# Patient Record
Sex: Female | Born: 1950 | Race: White | Hispanic: No | Marital: Married
Health system: Southern US, Community
[De-identification: ages and names within clinical notes are randomized; demographics above are authoritative.]

## PROBLEM LIST (undated history)

## (undated) DIAGNOSIS — E039 Hypothyroidism, unspecified: Secondary | ICD-10-CM

## (undated) DIAGNOSIS — K589 Irritable bowel syndrome without diarrhea: Secondary | ICD-10-CM

## (undated) DIAGNOSIS — G8929 Other chronic pain: Secondary | ICD-10-CM

## (undated) DIAGNOSIS — E079 Disorder of thyroid, unspecified: Secondary | ICD-10-CM

## (undated) DIAGNOSIS — I4891 Unspecified atrial fibrillation: Secondary | ICD-10-CM

## (undated) DIAGNOSIS — E119 Type 2 diabetes mellitus without complications: Secondary | ICD-10-CM

## (undated) HISTORY — PX: COLPOSCOPY W/ BIOPSY / CURETTAGE: SUR283

## (undated) HISTORY — PX: TUBAL LIGATION: SHX77

---

## 2020-07-13 ENCOUNTER — Emergency Department (HOSPITAL_COMMUNITY): Payer: Medicare Other

## 2020-07-13 ENCOUNTER — Encounter (HOSPITAL_COMMUNITY): Payer: Self-pay | Admitting: Emergency Medicine

## 2020-07-13 ENCOUNTER — Observation Stay (HOSPITAL_COMMUNITY)
Admission: EM | Admit: 2020-07-13 | Discharge: 2020-07-14 | Disposition: A | Discharge status: Home / Self Care | Payer: Medicare Other | Attending: Emergency Medicine | Admitting: Emergency Medicine

## 2020-07-13 ENCOUNTER — Other Ambulatory Visit: Payer: Self-pay

## 2020-07-13 DIAGNOSIS — I639 Cerebral infarction, unspecified: Secondary | ICD-10-CM | POA: Diagnosis not present

## 2020-07-13 DIAGNOSIS — Z79899 Other long term (current) drug therapy: Secondary | ICD-10-CM | POA: Insufficient documentation

## 2020-07-13 DIAGNOSIS — Z7982 Long term (current) use of aspirin: Secondary | ICD-10-CM | POA: Insufficient documentation

## 2020-07-13 DIAGNOSIS — Z7901 Long term (current) use of anticoagulants: Secondary | ICD-10-CM | POA: Insufficient documentation

## 2020-07-13 DIAGNOSIS — E039 Hypothyroidism, unspecified: Secondary | ICD-10-CM | POA: Insufficient documentation

## 2020-07-13 DIAGNOSIS — I10 Essential (primary) hypertension: Secondary | ICD-10-CM | POA: Insufficient documentation

## 2020-07-13 DIAGNOSIS — Z20822 Contact with and (suspected) exposure to covid-19: Secondary | ICD-10-CM | POA: Insufficient documentation

## 2020-07-13 DIAGNOSIS — I4891 Unspecified atrial fibrillation: Secondary | ICD-10-CM | POA: Insufficient documentation

## 2020-07-13 DIAGNOSIS — H538 Other visual disturbances: Secondary | ICD-10-CM | POA: Diagnosis present

## 2020-07-13 HISTORY — DX: Irritable bowel syndrome without diarrhea: K58.9

## 2020-07-13 HISTORY — DX: Other chronic pain: G89.29

## 2020-07-13 HISTORY — DX: Hypothyroidism, unspecified: E03.9

## 2020-07-13 HISTORY — DX: Unspecified atrial fibrillation: I48.91

## 2020-07-13 HISTORY — DX: Disorder of thyroid, unspecified: E07.9

## 2020-07-13 HISTORY — DX: Type 2 diabetes mellitus without complications: E11.9

## 2020-07-13 LAB — CBC
HCT: 46.5 % — ABNORMAL HIGH (ref 36.0–46.0)
Hemoglobin: 15.6 g/dL — ABNORMAL HIGH (ref 12.0–15.0)
MCH: 30.5 pg (ref 26.0–34.0)
MCHC: 33.5 g/dL (ref 30.0–36.0)
MCV: 90.8 fL (ref 80.0–100.0)
Platelets: 255 10*3/uL (ref 150–400)
RBC: 5.12 MIL/uL — ABNORMAL HIGH (ref 3.87–5.11)
RDW: 13.8 % (ref 11.5–15.5)
WBC: 10.1 10*3/uL (ref 4.0–10.5)
nRBC: 0 % (ref 0.0–0.2)

## 2020-07-13 LAB — COMPREHENSIVE METABOLIC PANEL
ALT: 23 U/L (ref 0–44)
AST: 31 U/L (ref 15–41)
Albumin: 4.3 g/dL (ref 3.5–5.0)
Alkaline Phosphatase: 65 U/L (ref 38–126)
Anion gap: 17 — ABNORMAL HIGH (ref 5–15)
BUN: 8 mg/dL (ref 8–23)
CO2: 28 mmol/L (ref 22–32)
Calcium: 9.8 mg/dL (ref 8.9–10.3)
Chloride: 93 mmol/L — ABNORMAL LOW (ref 98–111)
Creatinine, Ser: 0.98 mg/dL (ref 0.44–1.00)
GFR, Estimated: >60 mL/min (ref >60)
Glucose, Bld: 154 mg/dL — ABNORMAL HIGH (ref 70–99)
Potassium: 3.3 mmol/L — ABNORMAL LOW (ref 3.5–5.1)
Sodium: 138 mmol/L (ref 135–145)
Total Bilirubin: 1.3 mg/dL — ABNORMAL HIGH (ref 0.3–1.2)
Total Protein: 8.9 g/dL — ABNORMAL HIGH (ref 6.5–8.1)

## 2020-07-13 LAB — RESPIRATORY PANEL BY RT PCR (FLU A&B, COVID)
Influenza A by PCR: NEGATIVE
Influenza B by PCR: NEGATIVE
SARS Coronavirus 2 by RT PCR: NEGATIVE

## 2020-07-13 LAB — I-STAT CHEM 8, ED
BUN: 8 mg/dL (ref 8–23)
Calcium, Ion: 1.11 mmol/L — ABNORMAL LOW (ref 1.15–1.40)
Chloride: 94 mmol/L — ABNORMAL LOW (ref 98–111)
Creatinine, Ser: 0.9 mg/dL (ref 0.44–1.00)
Glucose, Bld: 150 mg/dL — ABNORMAL HIGH (ref 70–99)
HCT: 48 % — ABNORMAL HIGH (ref 36.0–46.0)
Hemoglobin: 16.3 g/dL — ABNORMAL HIGH (ref 12.0–15.0)
Potassium: 3.2 mmol/L — ABNORMAL LOW (ref 3.5–5.1)
Sodium: 139 mmol/L (ref 135–145)
TCO2: 30 mmol/L (ref 22–32)

## 2020-07-13 LAB — DIFFERENTIAL
Abs Immature Granulocytes: 0.03 10*3/uL (ref 0.00–0.07)
Basophils Absolute: 0.1 10*3/uL (ref 0.0–0.1)
Basophils Relative: 1 %
Eosinophils Absolute: 0 10*3/uL (ref 0.0–0.5)
Eosinophils Relative: 0 %
Immature Granulocytes: 0 %
Lymphocytes Relative: 10 %
Lymphs Abs: 1 10*3/uL (ref 0.7–4.0)
Monocytes Absolute: 0.6 10*3/uL (ref 0.1–1.0)
Monocytes Relative: 6 %
Neutro Abs: 8.3 10*3/uL — ABNORMAL HIGH (ref 1.7–7.7)
Neutrophils Relative %: 83 %

## 2020-07-13 LAB — GLUCOSE, CAPILLARY: Glucose-Capillary: 131 mg/dL — ABNORMAL HIGH (ref 70–99)

## 2020-07-13 LAB — APTT: aPTT: 32 seconds (ref 24–36)

## 2020-07-13 LAB — PROTIME-INR
INR: 1.2 (ref 0.8–1.2)
Prothrombin Time: 14.9 seconds (ref 11.4–15.2)

## 2020-07-13 IMAGING — MR MR HEAD WO/W CM
12 of 14 series · 40 of 48 positions shown · IV contrast (gadavist)
Comparison: Same day head CT.

EXAM:
MRI HEAD WITHOUT AND WITH CONTRAST
TECHNIQUE: Multiplanar, multiecho pulse sequences of the brain and surrounding
structures were obtained without and with intravenous contrast.

CONTRAST:  10mL GADAVIST GADOBUTROL 1 MMOL/ML IV SOLN

[Series 5: DWI · axial · 3.0mm · 0.88mm/px · z∈[-119,+28]mm · 8 of 100 slices shown (1 of 4)]
[im 1/100]
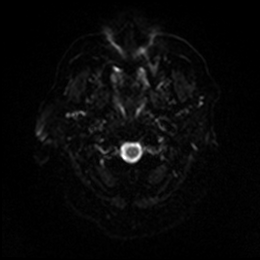
[im 15/100]
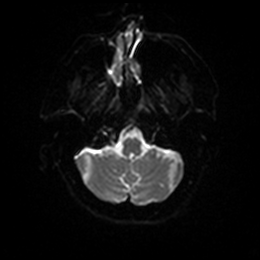
[im 29/100]
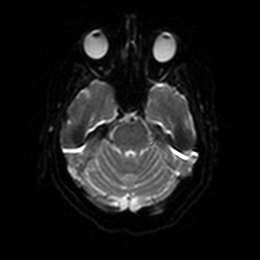
[im 43/100]
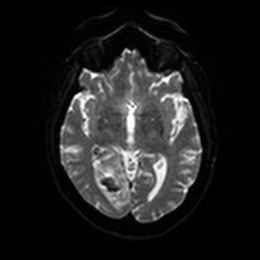
[im 57/100]
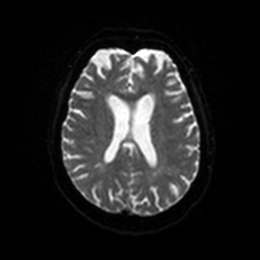
[im 71/100]
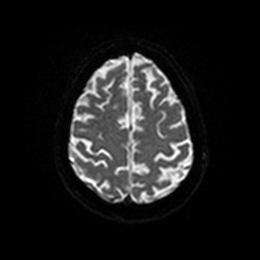
[im 85/100]
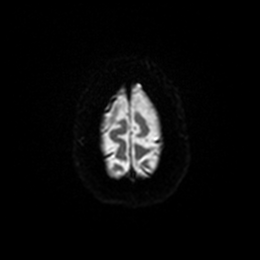
[im 100/100]
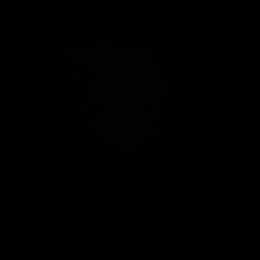

[Series 6: DWI · axial · 3.0mm · 0.88mm/px · z∈[-119,+25]mm · 4 of 49 slices shown (2 of 4)]
[im 1/49]
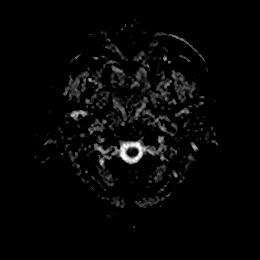
[im 17/49]
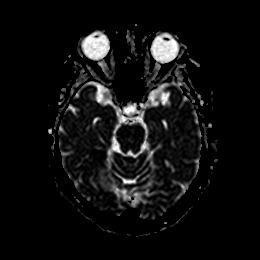
[im 33/49]
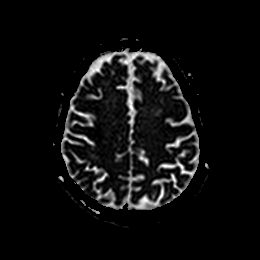
[im 49/49]
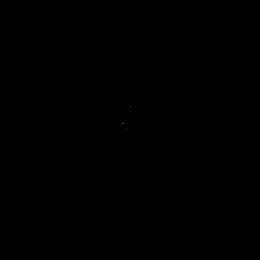

[Series 7: DWI · coronal · 4.0mm · 0.88mm/px · 5 of 68 slices shown (3 of 4)]
[im 1/68]
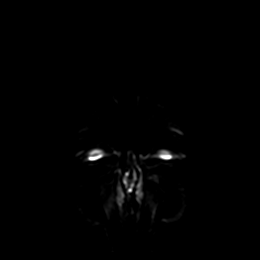
[im 17/68]
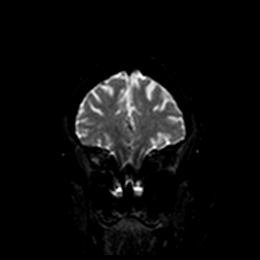
[im 34/68]
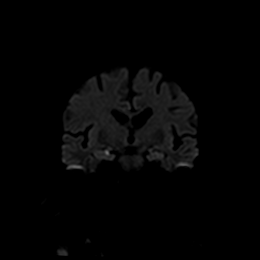
[im 51/68]
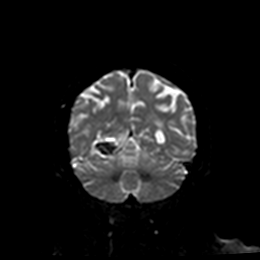
[im 68/68]
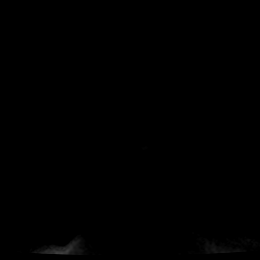

[Series 8: DWI · coronal · 4.0mm · 0.88mm/px · 3 of 34 slices shown (4 of 4)]
[im 1/34]
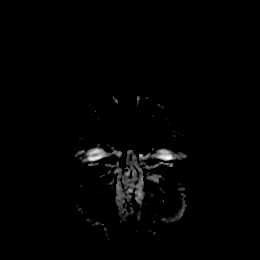
[im 17/34]
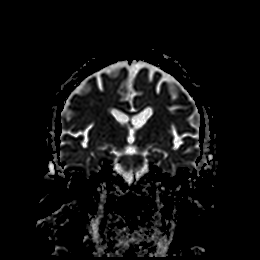
[im 34/34]
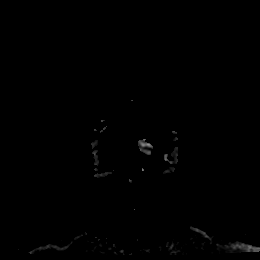

[Series 9: T1 · sagittal · 5.0mm · 0.75mm/px · 2 of 25 slices shown]
[im 1/25]
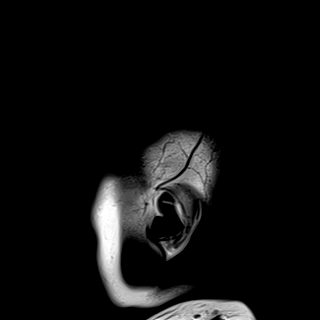
[im 25/25]
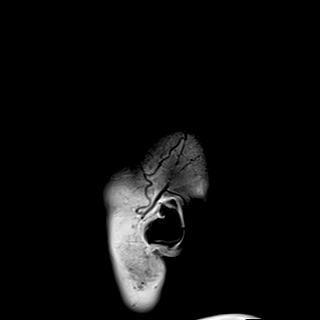

[Series 10: T2 · axial · 5.0mm · 0.72mm/px · z∈[-117,+26]mm · 2 of 25 slices shown]
[im 1/25]
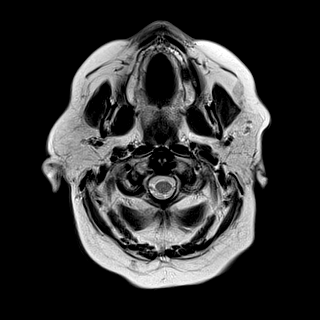
[im 25/25]
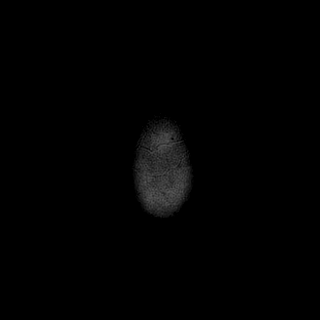

[Series 11: FLAIR · axial · 5.0mm · 0.45mm/px · z∈[-119,+25]mm · 2 of 25 slices shown]
[im 1/25]
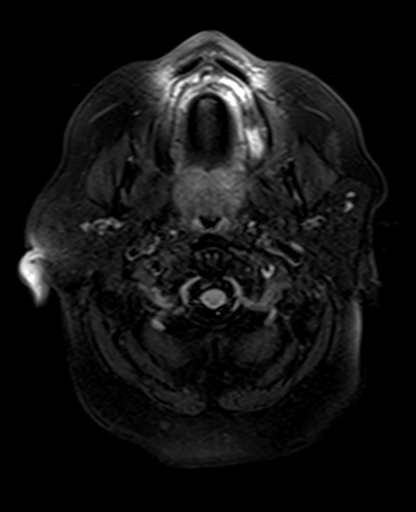
[im 25/25]
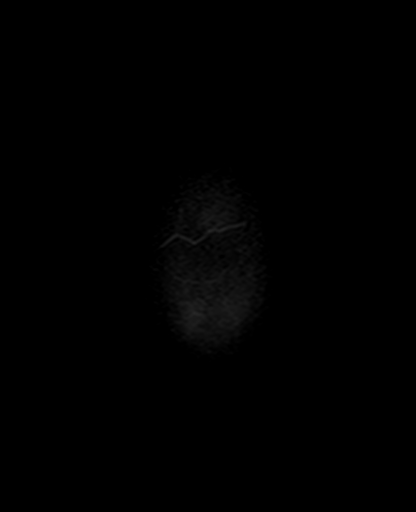

[Series 13: pha_images · axial · 3.0mm · 0.90mm/px · z∈[-129,+35]mm · 4 of 56 slices shown]
[im 1/56]
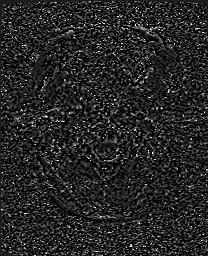
[im 19/56]
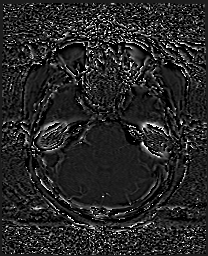
[im 37/56]
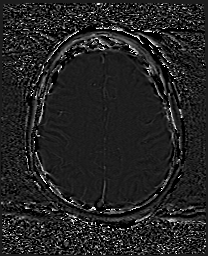
[im 56/56]
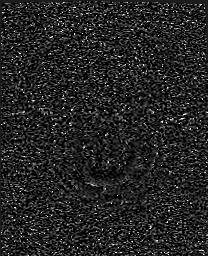

[Series 14: swi_images · axial · 3.0mm · 0.90mm/px · z∈[-129,+35]mm · 4 of 56 slices shown]
[im 1/56]
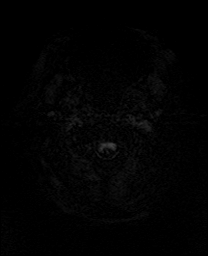
[im 19/56]
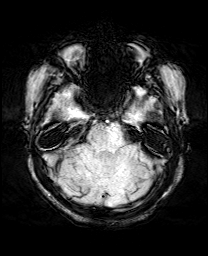
[im 37/56]
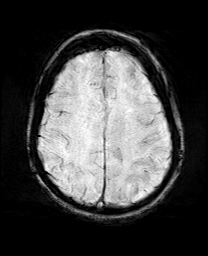
[im 56/56]
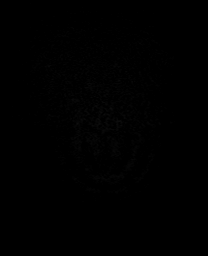

[Series 17: T2 post-contrast · coronal · 5.0mm · 0.72mm/px · 2 of 30 slices shown]
[im 1/30]
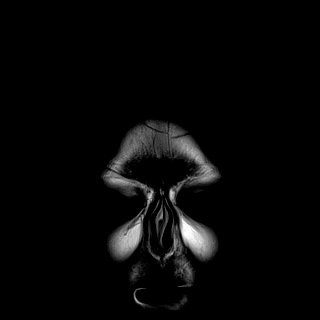
[im 30/30]
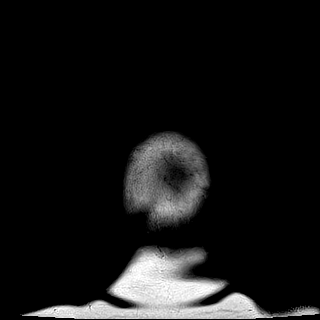

[Series 19: T1 post-contrast · coronal · 5.0mm · 0.34mm/px · 2 of 30 slices shown (1 of 2)]
[im 1/30]
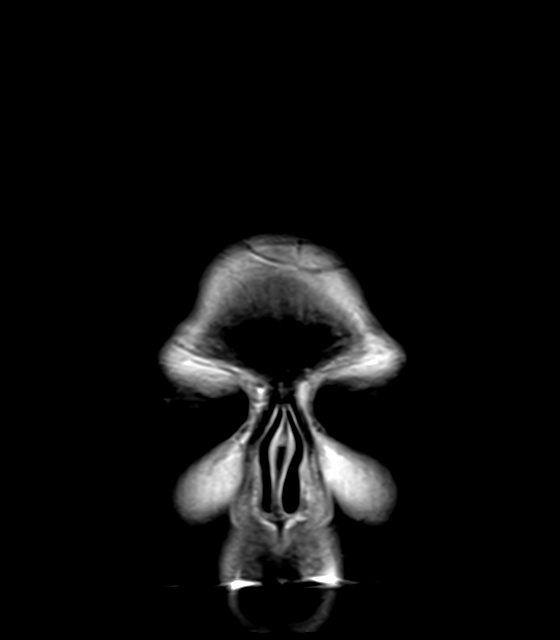
[im 30/30]
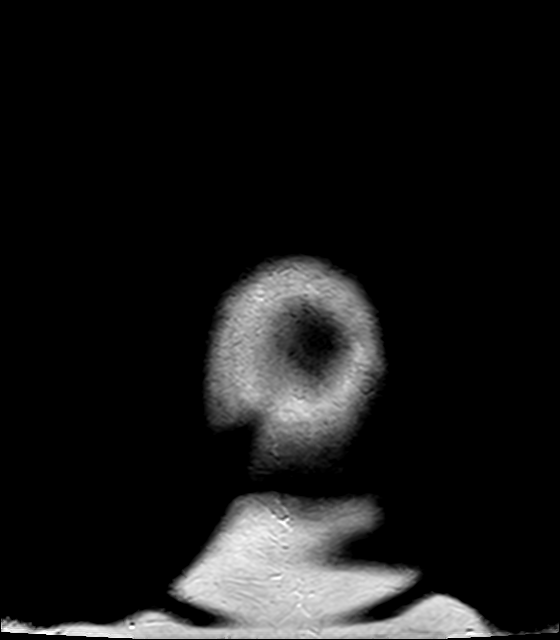

[Series 20: T1 post-contrast · sagittal · 5.0mm · 0.75mm/px · 2 of 25 slices shown (2 of 2)]
[im 1/25]
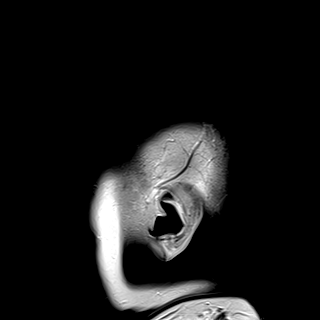
[im 25/25]
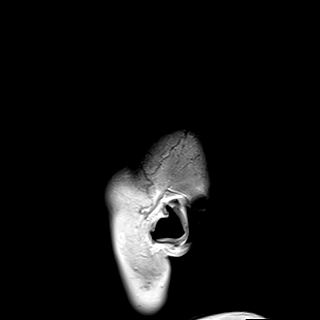

[40 of 48 positions shown; findings below may reference images not displayed]

FINDINGS: Brain: Acute confluent infarct in the right occipital lobe. There is
local mass effect without midline shift. Additional smaller infarcts
in the right hippocampus and right aspect of the splenium of the
corpus callosum. Possible punctate infarct in the right thalamus
(series 5, image 77). Associated acute hemorrhage in the right
occipital lobe and parahippocampal region . No hydrocephalus. Remote
bilateral cerebellar lacunar infarcts. Mild scattered T2/FLAIR
hyperintensities, compatible with chronic microvascular ischemic
disease. No masslike enhancement.

Vascular: Major arterial flow voids are maintained at the skull
base.

Skull and upper cervical spine: Normal marrow signal.

Sinuses/Orbits: Negative.  Atretic right maxillary sinus.

Other: No mastoid effusions.
IMPRESSION: Acute or early subacute right PCA territory infarcts involving the
right occipital lobe, right hippocampus, right eccentric splenium of
the corpus callosum, and possibly the right thalamus. There is
associated acute hemorrhage in the right occipital lobe and
parahippocampal region. Local mass effect without midline shift.

Finding discussed with Dr. OURARI At [DATE] via telephone.

## 2020-07-13 IMAGING — CT CT HEAD W/O CM
4 series · 15 of 47 positions shown, 17 images · non-contrast
Comparison: None.

CLINICAL DATA: Right-sided vision loss.

EXAM:
CT HEAD WITHOUT CONTRAST
TECHNIQUE: Contiguous axial images were obtained from the base of the skull
through the vertex without intravenous contrast.

[Series 3: head wo · axial · 0.47mm/px · z∈[+1198,+1302]mm · 7 of 29 slices shown, 9 images]
[im 4/29  brain]
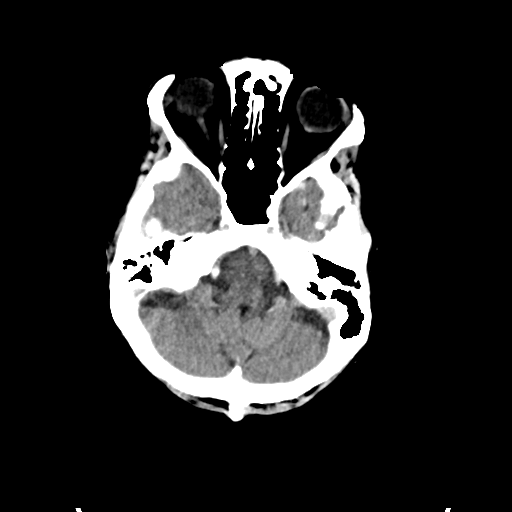
[im 4/29  bone]
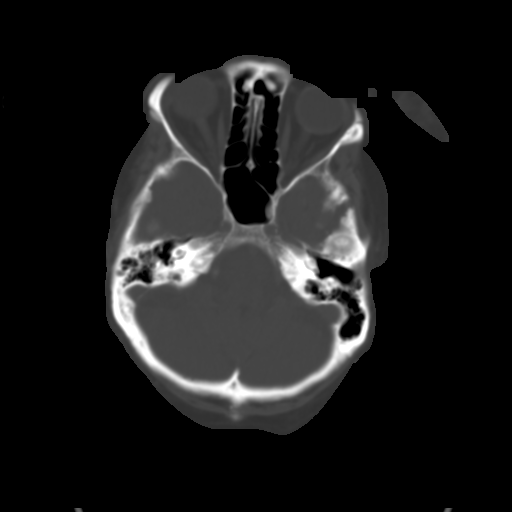
[im 8/29  brain]
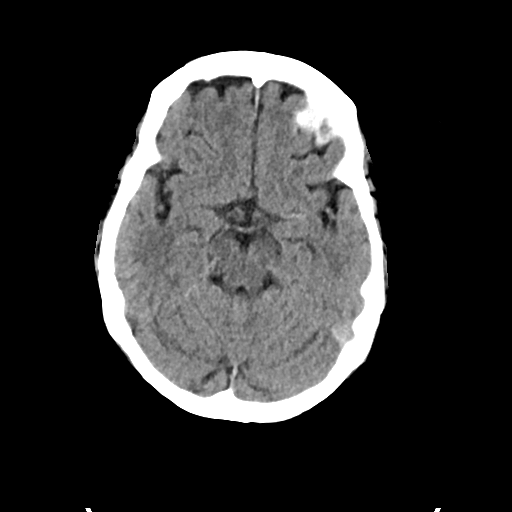
[im 11/29  brain]
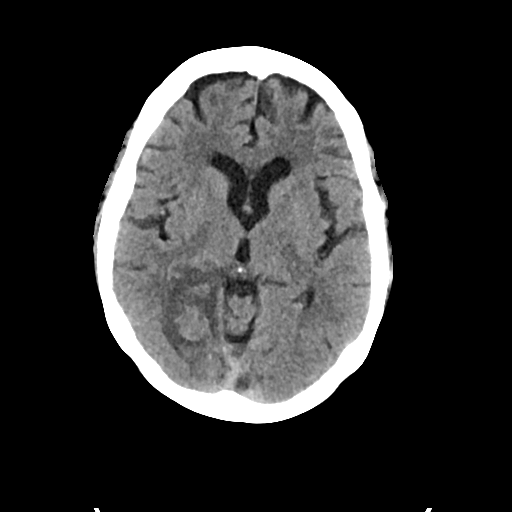
[im 15/29  brain]
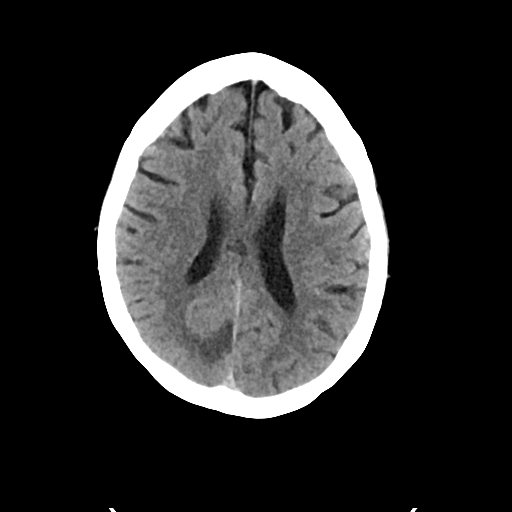
[im 18/29  brain]
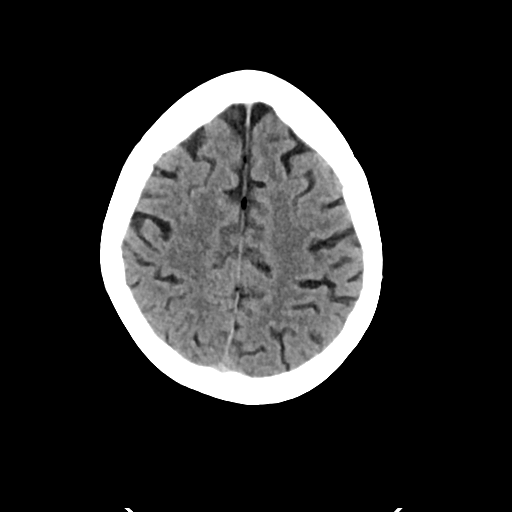
[im 18/29  bone]
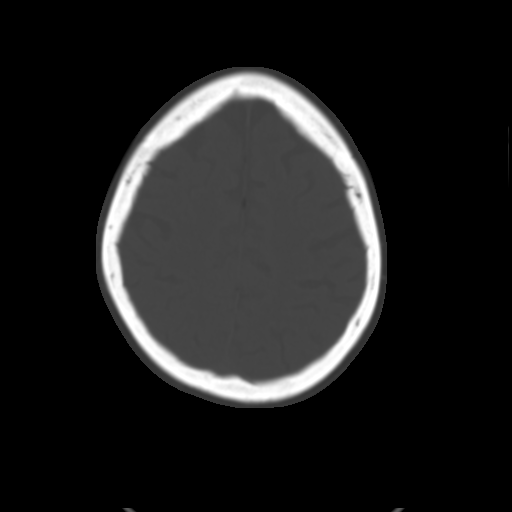
[im 22/29  brain]
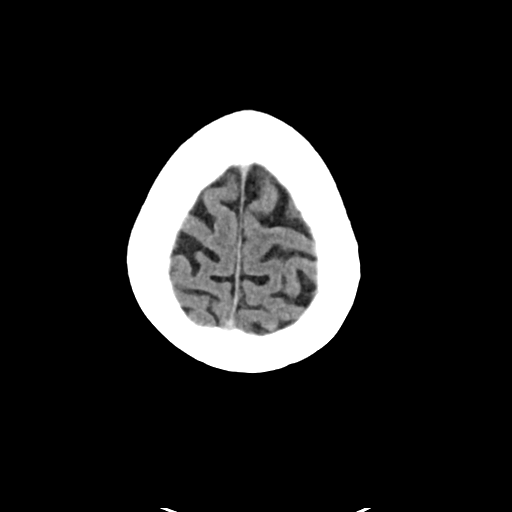
[im 25/29  brain]
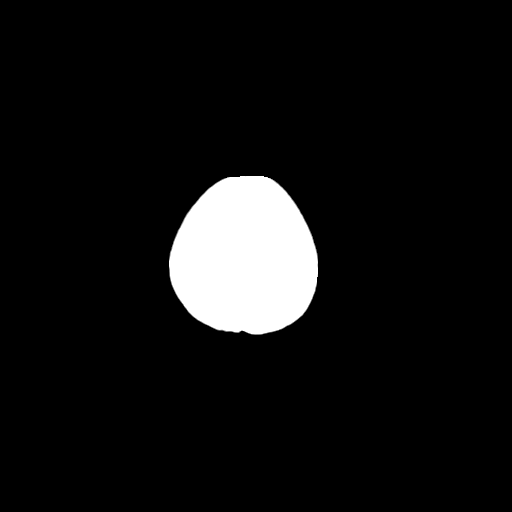

[Series 4: head bone · axial · 0.47mm/px · z∈[+1196,+1210]mm · 2 of 73 slices shown]
[im 8/73  bone]
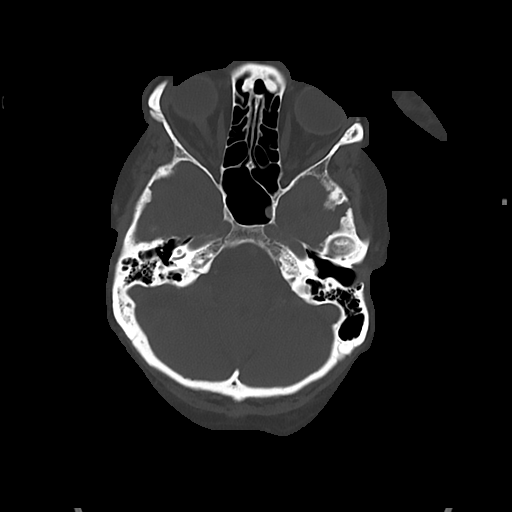
[im 15/73  bone]
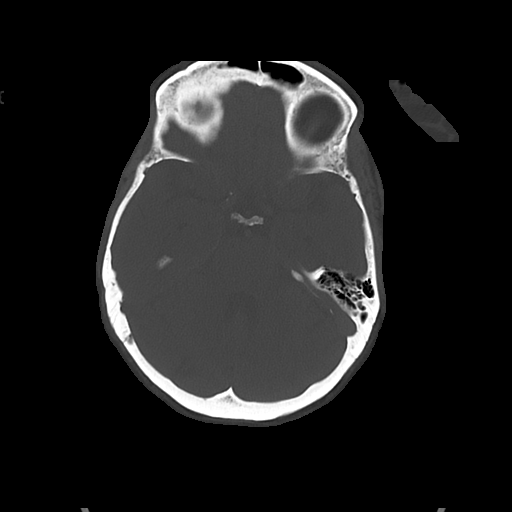

[Series 5: cor soft · coronal · 0.30mm/px · 3 of 70 slices shown]
[im 24/70  brain]
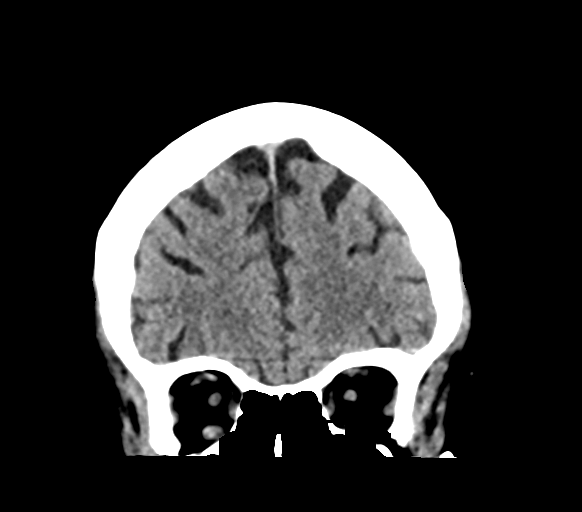
[im 31/70  brain]
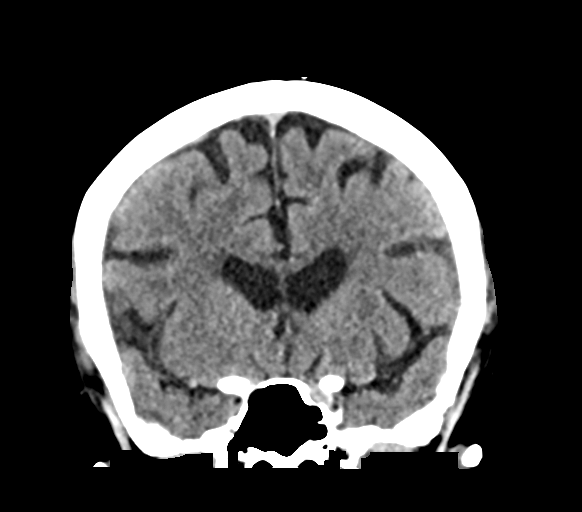
[im 39/70  brain]
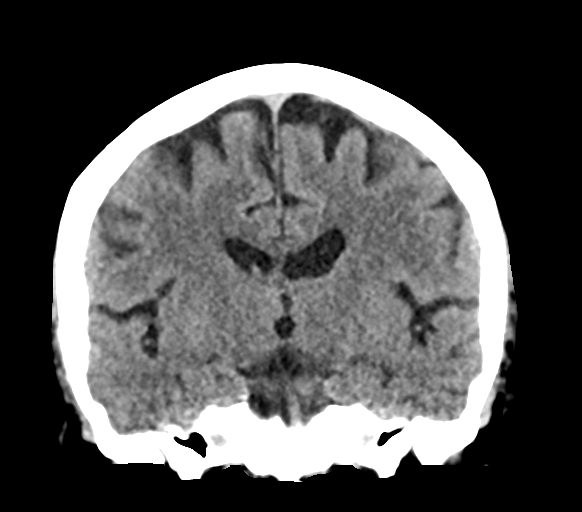

[Series 6: sag soft · sagittal · 0.30mm/px · 3 of 59 slices shown]
[im 20/59  brain]
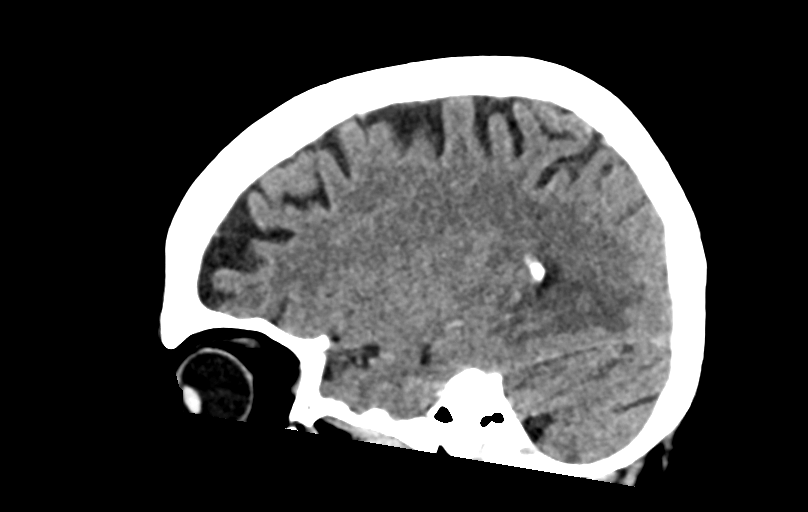
[im 30/59  brain]
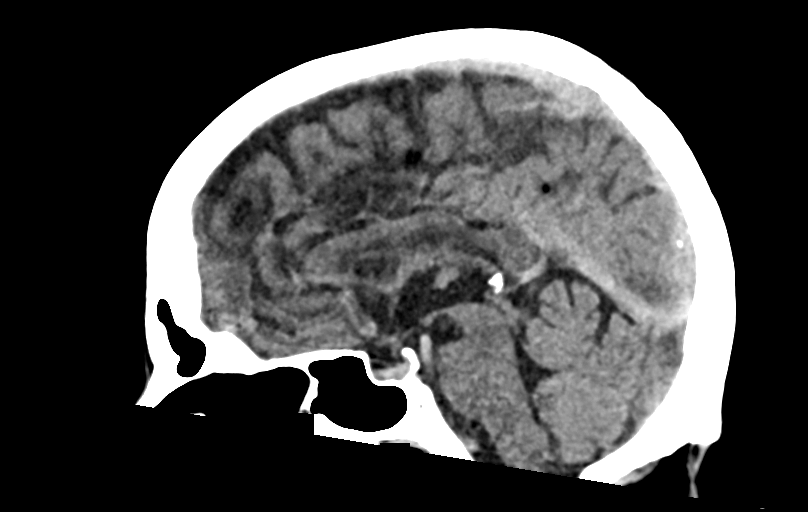
[im 39/59  brain]
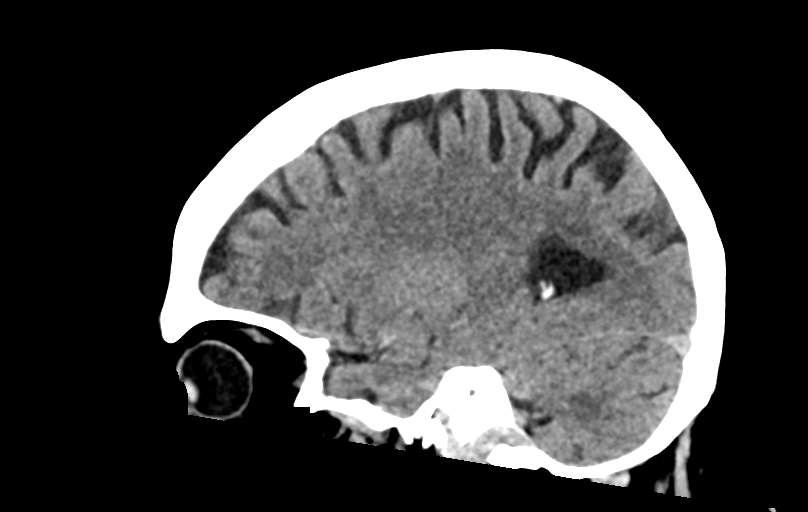

[15 of 47 positions shown; findings below may reference images not displayed]

FINDINGS: Brain: 1.7 x 1.3 cm lobulated abnormality is seen in the right
occipital lobe with surrounding white matter edema most consistent
with neoplasm or malignancy. Acute infarction cannot be excluded.
MRI is recommended for further evaluation. Mild chronic ischemic
white matter disease is noted. Ventricular size is within normal
limits. No midline shift is noted. No definite hemorrhage is noted.

Vascular: No hyperdense vessel or unexpected calcification.

Skull: Normal. Negative for fracture or focal lesion.

Sinuses/Orbits: No acute finding.

Other: None.
IMPRESSION: 1.7 x 1.3 cm lobulated abnormality is seen in the right occipital
lobe with surrounding white matter edema most consistent with
neoplasm or malignancy. Acute infarction cannot be excluded. MRI
with and without gadolinium is recommended for further evaluation.

## 2020-07-13 MED ORDER — LEVOTHYROXINE SODIUM 50 MCG PO TABS
50.0000 ug | ORAL_TABLET | Freq: Every day | ORAL | Status: DC
  Administered 2020-07-14: 50 ug via ORAL
  Filled 2020-07-13: qty 1

## 2020-07-13 MED ORDER — HYDRALAZINE HCL 20 MG/ML IJ SOLN: 10.0000 mg | Freq: Four times a day (QID) | INTRAMUSCULAR | Status: DC | PRN

## 2020-07-13 MED ORDER — ATORVASTATIN CALCIUM 10 MG PO TABS
20.0000 mg | ORAL_TABLET | Freq: Every day | ORAL | Status: DC
  Administered 2020-07-13: 20 mg via ORAL
  Filled 2020-07-13: qty 2

## 2020-07-13 MED ORDER — LEVOTHYROXINE SODIUM 50 MCG PO TABS: 50.0000 ug | ORAL_TABLET | Freq: Every day | ORAL | Status: DC

## 2020-07-13 MED ORDER — GADOBUTROL 1 MMOL/ML IV SOLN
10.0000 mL | Freq: Once | INTRAVENOUS | Status: AC | PRN
  Administered 2020-07-13: 10 mL via INTRAVENOUS

## 2020-07-13 MED ORDER — ACETAMINOPHEN 650 MG RE SUPP: 650.0000 mg | Freq: Four times a day (QID) | RECTAL | Status: DC | PRN

## 2020-07-13 MED ORDER — STROKE: EARLY STAGES OF RECOVERY BOOK
Freq: Once | Status: DC
  Filled 2020-07-13: qty 1

## 2020-07-13 MED ORDER — FLEET ENEMA 7-19 GM/118ML RE ENEM: 1.0000 | ENEMA | Freq: Once | RECTAL | Status: DC | PRN

## 2020-07-13 MED ORDER — GADOBUTROL 1 MMOL/ML IV SOLN: 10.0000 mL | Freq: Once | INTRAVENOUS | Status: DC | PRN

## 2020-07-13 MED ORDER — POLYETHYLENE GLYCOL 3350 17 G PO PACK: 17.0000 g | PACK | Freq: Every day | ORAL | Status: DC | PRN

## 2020-07-13 MED ORDER — ZOLPIDEM TARTRATE 5 MG PO TABS: 5.0000 mg | ORAL_TABLET | Freq: Every evening | ORAL | Status: DC | PRN

## 2020-07-13 MED ORDER — ALBUTEROL SULFATE (2.5 MG/3ML) 0.083% IN NEBU
2.5000 mg | INHALATION_SOLUTION | Freq: Four times a day (QID) | RESPIRATORY_TRACT | Status: DC
  Filled 2020-07-13 (×2): qty 3

## 2020-07-13 MED ORDER — SENNA 8.6 MG PO TABS
1.0000 | ORAL_TABLET | Freq: Two times a day (BID) | ORAL | Status: DC
  Administered 2020-07-13: 8.6 mg via ORAL
  Filled 2020-07-13 (×2): qty 1

## 2020-07-13 MED ORDER — DOCUSATE SODIUM 100 MG PO CAPS
100.0000 mg | ORAL_CAPSULE | Freq: Two times a day (BID) | ORAL | Status: DC
  Filled 2020-07-13 (×2): qty 1

## 2020-07-13 MED ORDER — HYDROCODONE-ACETAMINOPHEN 5-325 MG PO TABS
2.0000 | ORAL_TABLET | Freq: Once | ORAL | Status: AC
  Administered 2020-07-13: 2 via ORAL
  Filled 2020-07-13: qty 2

## 2020-07-13 MED ORDER — ONDANSETRON 4 MG PO TBDP
8.0000 mg | ORAL_TABLET | Freq: Once | ORAL | Status: AC
  Administered 2020-07-13: 8 mg via ORAL
  Filled 2020-07-13: qty 2

## 2020-07-13 MED ORDER — ACETAMINOPHEN 325 MG PO TABS
650.0000 mg | ORAL_TABLET | Freq: Four times a day (QID) | ORAL | Status: DC | PRN
  Administered 2020-07-14: 650 mg via ORAL
  Filled 2020-07-13: qty 2

## 2020-07-13 MED ORDER — METOPROLOL SUCCINATE ER 50 MG PO TB24
150.0000 mg | ORAL_TABLET | Freq: Every day | ORAL | Status: DC
  Administered 2020-07-13: 150 mg via ORAL
  Filled 2020-07-13: qty 1

## 2020-07-13 MED ORDER — ONDANSETRON HCL 4 MG/2ML IJ SOLN
4.0000 mg | Freq: Four times a day (QID) | INTRAMUSCULAR | Status: DC | PRN
  Administered 2020-07-13 – 2020-07-14 (×2): 4 mg via INTRAVENOUS
  Filled 2020-07-13 (×2): qty 2

## 2020-07-13 MED ORDER — MORPHINE SULFATE (PF) 2 MG/ML IV SOLN
2.0000 mg | INTRAVENOUS | Status: DC | PRN
  Administered 2020-07-13 – 2020-07-14 (×2): 2 mg via INTRAVENOUS
  Filled 2020-07-13 (×2): qty 1

## 2020-07-13 MED ORDER — BISACODYL 5 MG PO TBEC: 5.0000 mg | DELAYED_RELEASE_TABLET | Freq: Every day | ORAL | Status: DC | PRN

## 2020-07-13 NOTE — ED Notes (Addendum)
Pt returned from MRI °

## 2020-07-13 NOTE — ED Provider Notes (Addendum)
[facility] EMERGENCY DEPARTMENT Provider Note   CSN: [id number] Arrival date & time: 07/13/20  1225     History Chief Complaint  Patient presents with  . Eye Pain  . Blurred Vision    Samantha Klein is a 69 y.o. female with PMH of A. fib, hypothyroidism and hyperlipidemia who presented to the ED for evaluation of right eye pain vision loss.  Patient states she woke up Sunday morning with significant pain around her right temple and eye.  She also experienced some vision loss on the right eye. Patient was seen by her ophthalmologist, Dr. Marcille Buffy today who then referred patient to retina specialist, Dr. Lelan Pons. On evaluation, patient was found to have bilateral acute vision loss but no ophthalmic pathology. Patient was sent to [facility] ED for evaluation for possible CNS pathology. Patient endorses nausea and reports one episode of vomiting while at the retinal specialist office. Patient denies chest pain, shortness of breath, weakness, slurred speech, or dizziness.    Past Medical History:  Diagnosis Date  . Thyroid disease     There are no problems to display for this patient.   History reviewed. No pertinent surgical history.   OB History   No obstetric history on file.     No family history on file.  Social History   Tobacco Use  . Smoking status: Not on file  Substance Use Topics  . Alcohol use: Not on file  . Drug use: Not on file    Home Medications Prior to Admission medications   Medication Sig Start Date End Date Taking? Authorizing Provider  diclofenac (VOLTAREN) 75 MG EC tablet Take 75 mg by mouth at bedtime. 10/16/19  Yes [provider]  atorvastatin (LIPITOR) 20 MG tablet Take 20 mg by mouth at bedtime. 06/15/20   [provider]  chlorhexidine (PERIDEX) 0.12 % solution Use as directed 15 mLs in the mouth or throat 3 (three) times daily after meals. Swish and spit (do not swallow) 06/15/20   [provider]    dicyclomine (BENTYL) 20 MG tablet Take 20 mg by mouth See admin instructions. Take one tablet (20 mg) by mouth four times daily - before meals and at bedtime 06/15/20   [provider]  diltiazem (CARDIZEM CD) 180 MG 24 hr capsule Take 180 mg by mouth daily. 06/15/20   [provider]  levothyroxine (SYNTHROID) 50 MCG tablet Take 50 mcg by mouth daily before breakfast. 06/15/20   [provider]  metoprolol succinate (TOPROL-XL) 100 MG 24 hr tablet Take 150 mg by mouth at bedtime. 06/15/20   [provider]    Allergies    Tape and Sulfa antibiotics  Review of Systems   Review of Systems  Constitutional: Negative for chills and fever.  HENT: Negative for sinus pain.   Eyes: Positive for photophobia, pain and visual disturbance. Negative for redness.  Respiratory: Negative for cough and shortness of breath.   Cardiovascular: Negative for chest pain and palpitations.  Gastrointestinal: Positive for nausea and vomiting. Negative for abdominal pain.  Musculoskeletal: Negative for back pain.  Skin: Negative for rash.  Neurological: Positive for headaches. Negative for dizziness, syncope, weakness and numbness.  Psychiatric/Behavioral: Negative for agitation and confusion.    Physical Exam Updated Vital Signs BP (!) 178/112 (BP Location: Right Arm)   Pulse (!) 108   Temp 98.5 F (36.9 C) (Oral)   Resp 20   Ht 5\' 7"  (1.702 m)   Wt 99.8 kg  SpO2 99%   BMI 34.46 kg/m   Physical Exam Constitutional:      General: She is not in acute distress.    Appearance: Normal appearance.  HENT:     Head: Normocephalic and atraumatic.     Nose: Nose normal.  Eyes:     General: No scleral icterus.       Right eye: No discharge.        Left eye: No discharge.     Extraocular Movements: Extraocular movements intact.     Pupils: Pupils are equal, round, and reactive to light.     Comments: Dilated right eye.  Decreased visual acuity on the right.   Cardiovascular:     Rate and Rhythm: Regular rhythm. Tachycardia present.     Pulses: Normal pulses.     Heart sounds: Normal heart sounds.  Pulmonary:     Effort: Pulmonary effort is normal.     Breath sounds: Normal breath sounds. No wheezing.  Abdominal:     General: Bowel sounds are normal.     Palpations: Abdomen is soft.     Tenderness: There is no abdominal tenderness.  Musculoskeletal:        General: Normal range of motion.     Cervical back: Normal range of motion and neck supple.  Skin:    General: Skin is warm and dry.     Capillary Refill: Capillary refill takes less than 2 seconds.  Neurological:     General: No focal deficit present.     Mental Status: She is alert and oriented to person, place, and time.     Sensory: No sensory deficit.     Motor: No weakness.  Psychiatric:        Mood and Affect: Mood normal.     ED Results / Procedures / Treatments   Labs (all labs ordered are listed, but only abnormal results are displayed) Labs Reviewed  CBC - Abnormal; Notable for the following components:      Result Value   RBC 5.12 (*)    Hemoglobin 15.6 (*)    HCT 46.5 (*)    All other components within normal limits  DIFFERENTIAL - Abnormal; Notable for the following components:   Neutro Abs 8.3 (*)    All other components within normal limits  COMPREHENSIVE METABOLIC PANEL - Abnormal; Notable for the following components:   Potassium 3.3 (*)    Chloride 93 (*)    Glucose, Bld 154 (*)    Total Protein 8.9 (*)    Total Bilirubin 1.3 (*)    Anion gap 17 (*)    All other components within normal limits  I-STAT CHEM 8, ED - Abnormal; Notable for the following components:   Potassium 3.2 (*)    Chloride 94 (*)    Glucose, Bld 150 (*)    Calcium, Ion 1.11 (*)    Hemoglobin 16.3 (*)    HCT 48.0 (*)    All other components within normal limits  PROTIME-INR  APTT  CBG MONITORING, ED    EKG EKG Interpretation  Date/Time:  Monday July 13 2020  12:29:51 EST Ventricular Rate:  111 PR Interval:    QRS Duration: 90 QT Interval:  348 QTC Calculation: 473 R Axis:   55 Text Interpretation: Atrial fibrillation with rapid ventricular response Marked ST abnormality, possible inferior subendocardial injury Abnormal ECG No old tracing to compare Confirmed by Eber Hong ([id number]) on 07/13/2020 2:04:22 PM   Radiology CT HEAD WO CONTRAST  Result  Date: 07/13/2020 CLINICAL DATA:  Right-sided vision loss. EXAM: CT HEAD WITHOUT CONTRAST TECHNIQUE: Contiguous axial images were obtained from the base of the skull through the vertex without intravenous contrast. COMPARISON:  None. FINDINGS: Brain: 1.7 x 1.3 cm lobulated abnormality is seen in the right occipital lobe with surrounding white matter edema most consistent with neoplasm or malignancy. Acute infarction cannot be excluded. MRI is recommended for further evaluation. Mild chronic ischemic white matter disease is noted. Ventricular size is within normal limits. No midline shift is noted. No definite hemorrhage is noted. Vascular: No hyperdense vessel or unexpected calcification. Skull: Normal. Negative for fracture or focal lesion. Sinuses/Orbits: No acute finding. Other: None. IMPRESSION: 1.7 x 1.3 cm lobulated abnormality is seen in the right occipital lobe with surrounding white matter edema most consistent with neoplasm or malignancy. Acute infarction cannot be excluded. MRI with and without gadolinium is recommended for further evaluation. Electronically Signed   By: Lupita Raider M.D.   On: 07/13/2020 13:54    Procedures Procedures (including critical care time)  Medications Ordered in ED Medications  ondansetron (ZOFRAN-ODT) disintegrating tablet 8 mg (has no administration in time range)  HYDROcodone-acetaminophen (NORCO/VICODIN) 5-325 MG per tablet 2 tablet (has no administration in time range)    ED Course  I have reviewed the triage vital signs and the nursing notes.  Pertinent  labs & imaging results that were available during my care of the patient were reviewed by me and considered in my medical decision making (see chart for details).    MDM Rules/Calculators/A&P                          Patient 70 year old female with a history of A. fib and hypothyroidism who presented for evaluation of right eye pain and right-sided visual loss. On exam, patient has decreased visual acuity on the right but EOMI intact, no eye discharge or redness. CT head shows 1.7 x 1.3 cm lobulated abnormality in the right occipital lobe with surrounding white matter edema likely secondary to neoplasm or malignancy. Pending MRI and neurology consult.  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

## 2020-07-13 NOTE — Progress Notes (Signed)
Received female pt via stretcher from ER alert oriented not in distress on room air , transferred to bed with minimal assist, pt sitting on bed complaint of nauseated, pain on the right to middle of head with pain scale=8, connected to tele box 32, informed telemetry personnel

## 2020-07-13 NOTE — ED Notes (Signed)
Patient transported to MRI 

## 2020-07-13 NOTE — ED Provider Notes (Signed)
Care of the patient assumed at the change of shift pending MRI. Spoke with Radiologist and reviewed MRI images with him which shows an acute/subacute infarct of occipital region which corresponds to abnormality seen on CT. Will discuss with Neurology. Patient and husband aware of findings.   5:36 PM Spoke with Dr. Pola Corn, Hospitalist, who will evaluate the patient for admission.   5:39 PM Spoke with Dr. Derry Lory, Neurology, who will evaluate the patient. Recommends permissive hypertension given large area of infarct with upper limit of SBP 180.    Pollyann Savoy, MD 07/13/20 1740

## 2020-07-13 NOTE — ED Notes (Signed)
Call Husband cell and home first in case of emergency or for info/updates.  If you can't reach husband call son as last resort.  Contact info is in chart.

## 2020-07-13 NOTE — Consult Note (Signed)
NEUROLOGY CONSULTATION NOTE   Date of service: July 13, 2020 Patient Name: Samantha Klein MRN:  [id number] DOB:  10-Dec-1950 Reason for consult: "Stroke on MRI"  History of Present Illness  Samantha Klein is a 69 y.o. female with PMH significant for atrial fibrillation, hypothyroidism, HLD who presents with L hemianopsia and nausea and vomiting.  Patient reports that on Sunday morning she woke up and had sharp right temple pain that felt like it was going over her head and onto the other side.  The pain was so terrible that she could not focus on things.  She noted that when she attempted to unlock her phone with a pen, she could not see the numbers on the left side.  She reports that the problem persisted and so she saw an ophthalmologist on Monday.  Ophthalmologist dilated her eyes and did not find any ocular abnormality and she was sent to the emergency department for assessment for acute stroke.  She had a CT head which demonstrated a potential right occipital mass.  She had an MRI brain which demonstrated a right occipital stroke with secondary hemorrhage.  She reports that her headache improved significantly after getting medication here in the ED.  She has some nausea and vomiting but she feels like that has improved too.  LKW: 1000 on 07/12/20. NIHSS: 2 for L hemianopsia MRS: 0 TPA: outside window at presentation Thrombectomy: outside window at presentation.   ROS   Constitutional Denies weight loss, fever and chills.   HEENT Denies changes in vision and hearing.   Respiratory Denies SOB and cough.   CV Denies palpitations and CP   GI Denies abdominal pain, nausea, vomiting and diarrhea.   GU Denies dysuria and urinary frequency.   MSK Denies myalgia and joint pain.   Skin Denies rash and pruritus.   Neurological Denies headache and syncope.   Psychiatric Denies recent changes in mood. Denies anxiety and depression.    Past History   Past Medical History:   Diagnosis Date  . Thyroid disease    History reviewed. No pertinent surgical history. No family history on file. Social History   Socioeconomic History  . Marital status: Married    Spouse name: Not on file  . Number of children: Not on file  . Years of education: Not on file  . Highest education level: Not on file  Occupational History  . Not on file  Tobacco Use  . Smoking status: Not on file  Substance and Sexual Activity  . Alcohol use: Not on file  . Drug use: Not on file  . Sexual activity: Not on file  Other Topics Concern  . Not on file  Social History Narrative  . Not on file   Social Determinants of Health   Financial Resource Strain:   . Difficulty of Paying Living Expenses: Not on file  Food Insecurity:   . Worried About Programme researcher, broadcasting/film/video in the Last Year: Not on file  . Ran Out of Food in the Last Year: Not on file  Transportation Needs:   . Lack of Transportation (Medical): Not on file  . Lack of Transportation (Non-Medical): Not on file  Physical Activity:   . Days of Exercise per Week: Not on file  . Minutes of Exercise per Session: Not on file  Stress:   . Feeling of Stress : Not on file  Social Connections:   . Frequency of Communication with Friends and Family: Not on file  .  Frequency of Social Gatherings with Friends and Family: Not on file  . Attends Religious Services: Not on file  . Active Member of Clubs or Organizations: Not on file  . Attends Banker Meetings: Not on file  . Marital Status: Not on file   Allergies  Allergen Reactions  . Tape Other (See Comments)    Adhesive tape causes redness  . Sulfa Antibiotics Rash    Medications  (Not in a hospital admission)    Vitals   Vitals:   07/13/20 1237 07/13/20 1404 07/13/20 1548 07/13/20 1717  BP: (!) 182/106 (!) 178/112 (!) 185/116 (!) 172/111  Pulse: (!) 125 (!) 108 (!) 114 97  Resp: 16 20 19  (!) 24  Temp: 98.5 F (36.9 C)     TempSrc: Oral     SpO2: 98%  99% 96% 94%  Weight: 99.8 kg     Height: 5\' 7"  (1.702 m)        Body mass index is 34.46 kg/m.  Physical Exam   General: Laying comfortably in bed; in no acute distress.  HENT: Normal oropharynx and mucosa. Normal external appearance of ears and nose.  Neck: Supple, no pain or tenderness  CV: No JVD. No peripheral edema.  Pulmonary: Symmetric Chest rise. Normal respiratory effort.  Abdomen: Soft to touch, non-tender.  Ext: No cyanosis, edema, or deformity  Skin: No rash. Normal palpation of skin.   Musculoskeletal: Normal digits and nails by inspection. No clubbing.   Neurologic Examination  Mental status/Cognition: Alert, oriented to self, place, month and year, good attention.  Speech/language: Fluent, comprehension intact, object naming intact, repetition intact.  Cranial nerves:   CN II Pupils equal and reactive to light, L hemianopsia.   CN III,IV,VI EOM intact, no gaze preference or deviation, no nystagmus    CN V normal sensation in V1, V2, and V3 segments bilaterally    CN VII no asymmetry, no nasolabial fold flattening    CN VIII normal hearing to speech    CN IX & X normal palatal elevation, no uvular deviation    CN XI 5/5 head turn and 5/5 shoulder shrug bilaterally    CN XII midline tongue protrusion    Motor:  Muscle bulk: normal, tone normal, pronator drift none tremor none Mvmt Root Nerve  Muscle Right Left Comments  SA C5/6 Ax Deltoid 5 5   EF C5/6 Mc Biceps 5 5   EE C6/7/8 Rad Triceps 5 5   WF C6/7 Med FCR 5 5   WE C7/8 PIN ECU 5 5   F Ab C8/T1 U ADM/FDI 5 5   HF L1/2/3 Fem Illopsoas 5 5   KE L2/3/4 Fem Quad 5 5   DF L4/5 D Peron Tib Ant 5 5   PF S1/2 Tibial Grc/Sol 5 5    Reflexes:  Right Left Comments  Pectoralis      Biceps (C5/6) 2 2   Brachioradialis (C5/6) 2 2    Triceps (C6/7) 2 2    Patellar (L3/4) 2 2    Achilles (S1) 1 1    Hoffman      Plantar     Jaw jerk    Sensation:  Light touch Intact throughout   Pin prick    Temperature     Vibration   Proprioception    Coordination/Complex Motor:  - Finger to Nose intact BL - Heel to shin intact BL - Rapid alternating movement are normal  Labs   CBC:  Recent Labs  Lab 07/13/20 1239 07/13/20 1352  WBC 10.1  --   NEUTROABS 8.3*  --   HGB 15.6* 16.3*  HCT 46.5* 48.0*  MCV 90.8  --   PLT 255  --     Basic Metabolic Panel:  Lab Results  Component Value Date   NA 139 07/13/2020   K 3.2 (L) 07/13/2020   CO2 28 07/13/2020   GLUCOSE 150 (H) 07/13/2020   BUN 8 07/13/2020   CREATININE 0.90 07/13/2020   CALCIUM 9.8 07/13/2020   GFRNONAA >60 07/13/2020   Lipid Panel: No results found for: LDLCALC HgbA1c: No results found for: HGBA1C Urine Drug Screen: No results found for: LABOPIA, COCAINSCRNUR, LABBENZ, AMPHETMU, THCU, LABBARB  Alcohol Level No results found for: [facility]   Results for orders placed during the hospital encounter of 07/13/20  CT HEAD WO CONTRAST  Narrative CLINICAL DATA:  Right-sided vision loss.  EXAM: CT HEAD WITHOUT CONTRAST  IMPRESSION: 1.7 x 1.3 cm lobulated abnormality is seen in the right occipital lobe with surrounding white matter edema most consistent with neoplasm or malignancy. Acute infarction cannot be excluded. MRI with and without gadolinium is recommended for further evaluation.  MRI Brain with and without contrast: Acute or early subacute right PCA territory infarcts involving the right occipital lobe, right hippocampus, right eccentric splenium of the corpus callosum, and possibly the right thalamus. There is associated acute hemorrhage in the right occipital lobe and parahippocampal region. Local mass effect without midline shift.  Impression   Samantha Klein is a 69 y.o. 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 Her neurologic examination is  notable for L hemianopsia. She is outside window for tPA or thrombectomy. We will get stroke workup. Modified SBP goal and repeat CTH given hemorrhage.  Recommendations  Plan:  - I ordered Frequent Neuro checks per stroke unit protocol - I ordered vascular imaging with MRA Angio Head without contrast and MR angio neck with contrast - I ordered TTE - I ordered Lipid panel with LDL - Please start statin if LDL > 70 - Recommend HbA1c - Antithrombotic - hold off for now given secondary hemorrhagic transformation. - Repeat CTH in AM ordered. - Recommend DVT prophylaxis. - SBP goal - < 160 given hemorrhage - I ordered Telemetry monitoring for arrythmia - I ordered bedside swallow screen prior to PO intake. - I ordered Stroke education booklet - I ordered PT/OT/SLP consult   ______________________________________________________________________   Thank you for the opportunity to take part in the care of this patient. If you have any further questions, please contact the neurology consultation attending.  Signed,  Erick Blinks [facility] Pager Number [phone]

## 2020-07-13 NOTE — H&P (Signed)
[facility] History and Physical  Samantha Klein YYQ:[id number] DOB: [birth year] DOA: 07/13/2020 PCP: Pcp, No  Admitted from: Home Chief Complaint: Right eye pain and vision impairment  History of Present Illness: Samantha Klein is a 69 y.o. female with past medical history of A. fib, hyperlipidemia hypothyroidism. Patient presented to the ED from home today with complaint of severe pain behind the right eye with difficulty and vision impairment.  11/21, on waking up in the morning, patient felt severe pain behind the right eye and had trouble with right eye vision.  She went to see her ophthalmologist.  On evaluation, she was found to have acute loss of vision but no ophthalmic pathology. She was sent to the ED to rule out stroke.  Patient reports nausea and an episode of vomiting while at the ophthalmologist's office.  In the ED, patient was afebrile, heart rate 125, blood pressure elevated to 182/106. Labs with potassium low at 3.3, blood glucose level elevated to 154.  CT scan of head showed 1.7 x 1.3 cm lobulated abnormality in the right occipital lobe with surrounding white matter edema. MRI brain was obtained which showed an acute or early subacute right PCA territory infarcts involving the right occipital lobe, right hippocampus, right eccentric splenium of the corpus callosum, and possibly the right thalamus. There is also an associated acute hemorrhage in the right occipital lobe and parahippocampal region. Local mass effect without midline shift. Neurology was consulted from ED. Hospitalist service consulted for stroke work-up and admission.  At the time of my evaluation, patient was lying down in bed.  Alert, awake, oriented x3.  Continue to have eye symptoms.  No other symptoms.  Husband at bedside.   Heart rate in 90s, irregular, blood pressure in 140s.  Review of Systems:  All systems were reviewed and were negative unless otherwise mentioned in the HPI   Past  medical history: Past Medical History:  Diagnosis Date  . Thyroid disease     Past surgical history: History reviewed. No pertinent surgical history.  Social History:  has no history on file for tobacco use, alcohol use, and drug use.  Allergies:  Allergies  Allergen Reactions  . Tape Other (See Comments)    Adhesive tape causes redness  . Sulfa Antibiotics Rash    Family history:  No family history on file.  Reviewed with patient.  Home Meds: Prior to Admission medications   Medication Sig Start Date End Date Taking? Authorizing Provider  acetaminophen (TYLENOL) 500 MG tablet Take 1,000 mg by mouth every 4 (four) hours as needed (pain).   Yes [provider]  atorvastatin (LIPITOR) 20 MG tablet Take 20 mg by mouth at bedtime. 06/15/20  Yes [provider]  chlorhexidine (PERIDEX) 0.12 % solution Use as directed 15 mLs in the mouth or throat 3 (three) times daily after meals. Swish and spit (do not swallow) 06/15/20  Yes [provider]  diclofenac (VOLTAREN) 75 MG EC tablet Take 75 mg by mouth at bedtime. 10/16/19  Yes [provider]  dicyclomine (BENTYL) 20 MG tablet Take 20 mg by mouth See admin instructions. Take one tablet (20 mg) by mouth four times daily - before meals and at bedtime 06/15/20  Yes [provider]  diltiazem (CARDIZEM CD) 180 MG 24 hr capsule Take 180 mg by mouth daily. 06/15/20  Yes [provider]  levothyroxine (SYNTHROID) 50 MCG tablet Take 50 mcg by mouth daily before breakfast. 06/15/20  Yes [provider]  metoprolol succinate (TOPROL-XL) 100 MG 24 hr tablet Take 150 mg by mouth at bedtime. 06/15/20  Yes [provider]    Physical Exam: Vitals:   07/13/20 1237 07/13/20 1404 07/13/20 1548 07/13/20 1717  BP: (!) 182/106 (!) 178/112 (!) 185/116 (!) 172/111  Pulse: (!) 125 (!) 108 (!) 114 97  Resp: 16 20 19  (!) 24  Temp: 98.5 F (36.9 C)     TempSrc: Oral     SpO2: 98% 99%  96% 94%  Weight: 99.8 kg     Height: 5\' 7"  (1.702 m)      Wt Readings from Last 3 Encounters:  07/13/20 99.8 kg   Body mass index is 34.46 kg/m.  General exam: Not in physical distress Skin: No rashes, lesions or ulcers. HEENT: Atraumatic, normocephalic, no obvious bleeding Lungs: Clear to auscultation bilaterally CVS: Irregular rhythm, controlled rate, no murmur GI/Abd soft, nontender, nondistended, bowel sound present CNS: Alert, awake, oriented x3.  Defer to neurologist exam for eye findings Psychiatry: Mood appropriate Extremities: No pedal edema, no calf tenderness     Consult Orders  (From admission, onward)         Start     Ordered   07/13/20 1823  PT eval and treat  Routine        07/13/20 1825   07/13/20 1823  OT eval and treat  Routine        07/13/20 1825   07/13/20 1823  SLP eval and treat Reason for evaluation: Cognitive/Language evaluation  Once       Question:  Reason for evaluation  Answer:  Cognitive/Language evaluation   07/13/20 1825   07/13/20 1751  SLP eval and treat Reason for evaluation: Cognitive/Language evaluation, .Swallowing evaluation (BSE, MBS and/or diet order as indicated)  Once       Question Answer Comment  Reason for evaluation Cognitive/Language evaluation   Reason for evaluation .Swallowing evaluation (BSE, MBS and/or diet order as indicated)      07/13/20 1750          Labs on Admission:   CBC: Recent Labs  Lab 07/13/20 1239 07/13/20 1352  WBC 10.1  --   NEUTROABS 8.3*  --   HGB 15.6* 16.3*  HCT 46.5* 48.0*  MCV 90.8  --   PLT 255  --     Basic Metabolic Panel: Recent Labs  Lab 07/13/20 1239 07/13/20 1352  NA 138 139  K 3.3* 3.2*  CL 93* 94*  CO2 28  --   GLUCOSE 154* 150*  BUN 8 8  CREATININE 0.98 0.90  CALCIUM 9.8  --     Liver Function Tests: Recent Labs  Lab 07/13/20 1239  AST 31  ALT 23  ALKPHOS 65  BILITOT 1.3*  PROT 8.9*  ALBUMIN 4.3   No results for input(s): LIPASE, AMYLASE in the  last 168 hours. No results for input(s): AMMONIA in the last 168 hours.  Cardiac Enzymes: No results for input(s): CKTOTAL, CKMB, CKMBINDEX, TROPONINI in the last 168 hours.  BNP (last 3 results) No results for input(s): BNP in the last 8760 hours.  ProBNP (last 3 results) No results for input(s): PROBNP in the last 8760 hours.  CBG: No results for input(s): GLUCAP in the last 168 hours.  Lipase  No results found for: LIPASE   Urinalysis No results found for: COLORURINE, APPEARANCEUR, LABSPEC, PHURINE, GLUCOSEU, HGBUR, BILIRUBINUR, KETONESUR, PROTEINUR, UROBILINOGEN, NITRITE, LEUKOCYTESUR   Drugs of Abuse  No results found for: LABOPIA, COCAINSCRNUR, LABBENZ,  AMPHETMU, THCU, LABBARB    Radiological Exams on Admission: CT HEAD WO CONTRAST  Result Date: 07/13/2020 CLINICAL DATA:  Right-sided vision loss. EXAM: CT HEAD WITHOUT CONTRAST TECHNIQUE: Contiguous axial images were obtained from the base of the skull through the vertex without intravenous contrast. COMPARISON:  None. FINDINGS: Brain: 1.7 x 1.3 cm lobulated abnormality is seen in the right occipital lobe with surrounding white matter edema most consistent with neoplasm or malignancy. Acute infarction cannot be excluded. MRI is recommended for further evaluation. Mild chronic ischemic white matter disease is noted. Ventricular size is within normal limits. No midline shift is noted. No definite hemorrhage is noted. Vascular: No hyperdense vessel or unexpected calcification. Skull: Normal. Negative for fracture or focal lesion. Sinuses/Orbits: No acute finding. Other: None. IMPRESSION: 1.7 x 1.3 cm lobulated abnormality is seen in the right occipital lobe with surrounding white matter edema most consistent with neoplasm or malignancy. Acute infarction cannot be excluded. MRI with and without gadolinium is recommended for further evaluation. Electronically Signed   By: Lupita Raider M.D.   On: 07/13/2020 13:54   MR Brain W and Wo  Contrast  Result Date: 07/13/2020 EXAM: MRI HEAD WITHOUT AND WITH CONTRAST TECHNIQUE: Multiplanar, multiecho pulse sequences of the brain and surrounding structures were obtained without and with intravenous contrast. CONTRAST:  20mL GADAVIST GADOBUTROL 1 MMOL/ML IV SOLN COMPARISON:  Same day head CT. FINDINGS: Brain: Acute confluent infarct in the right occipital lobe. There is local mass effect without midline shift. Additional smaller infarcts in the right hippocampus and right aspect of the splenium of the corpus callosum. Possible punctate infarct in the right thalamus (series 5, image 77). Associated acute hemorrhage in the right occipital lobe and parahippocampal region . No hydrocephalus. Remote bilateral cerebellar lacunar infarcts. Mild scattered T2/FLAIR hyperintensities, compatible with chronic microvascular ischemic disease. No masslike enhancement. Vascular: Major arterial flow voids are maintained at the skull base. Skull and upper cervical spine: Normal marrow signal. Sinuses/Orbits: Negative.  Atretic right maxillary sinus. Other: No mastoid effusions. IMPRESSION: Acute or early subacute right PCA territory infarcts involving the right occipital lobe, right hippocampus, right eccentric splenium of the corpus callosum, and possibly the right thalamus. There is associated acute hemorrhage in the right occipital lobe and parahippocampal region. Local mass effect without midline shift. Finding discussed with Dr. Bernette Mayers At 5:10 PM via telephone. 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 -Seen by ophthalmologist as an outpatient, ruled out local issue and sent to ED for  possible stroke -CT scan of head and MRI brain finding as above showing acute to early subacute right PCA territory infarcts.  MRI also showed post infarct acute hemorrhage in the right occipital lobe. -Neurology consult called from ED. -Stroke work-up ordered including echocardiogram, carotid duplex, A1c lipid panel, TSH, PT/OT/ST evaluation. -Prior to admission, patient was on Lipitor. -Because of presence of hemorrhage, I would defer to neurology for the timing of initiation of antiplatelet versus anticoagulation.  A. fib with RVR -Patient history of A. fib.  Home meds include Cardizem 180 mg daily and Toprol 150 mg at bedtime. -Heart rate was initially elevated 225 bpm in the ED.  Currently in 90s. -Patient takes Toprol at nighttime.  Will continue tonight.  I will keep Cardizem on hold.  May need to resume it tomorrow morning based on heart rate and blood pressure.  Discussed with neurologist. -Prior to admission, see was not on any antiplatelet or anticoagulant.  Avoid blood thinner for now because of intracranial hemorrhage.  May benefit from anticoagulation long-term.  Accelerated hypertension -Blood pressure elevated up to 180s in the ED.  Home meds resumed.  IV hydralazine as needed ordered. Target blood pressure less than 160 systolic.  Discussed with neurologist. -Continue to monitor blood pressure.  Hypothyroidism -Continue Synthroid.  Obtain TSH  Hyperglycemia -Blood sugar running in 150s.  Obtain A1c.  Mobility: PT/OT eval ordered Code Status: full code DVT prophylaxis:  SCDs.  Avoid heparin or Lovenox for now Antimicrobials:  None Fluid: None  Diet:  Diet Order            Diet NPO time specified  Diet effective now               Okay to allow cardiac diet if passes bedside eval  Consultants: Neurology Family Communication:  Discussed with husband at bedside.  Dispo: The patient is from: Home              Anticipated d/c is to: Home likely, pending PT eval               Anticipated d/c date is: 2-3 days  ------------------------------------------------------------------------------------- Severity of Illness: The appropriate patient status for this patient is INPATIENT. Inpatient status is judged to be reasonable and necessary in order to provide the required intensity of service to ensure the patient's safety. The patient's presenting symptoms, physical exam findings, and initial radiographic and laboratory data in the context of their chronic comorbidities is felt to place them at high risk for further clinical deterioration. Furthermore, it is not anticipated that the patient will be medically stable for discharge from the hospital within 2 midnights of admission. The following factors support the patient status of inpatient.   " The patient's presenting symptoms include right eye vision loss and pain. " The worrisome physical exam findings include right eye vision loss. " The initial radiographic and laboratory data are worrisome because of right occipital lobe infarction on MRI. " The chronic co-morbidities include A. fib, hypertension, hyperlipidemia   * I certify that at the point of admission it is my clinical judgment that the patient will require inpatient hospital care spanning beyond 2 midnights from the point of admission due to high intensity of service, high risk for further deterioration and high frequency of surveillance required.*   Signed, Lorin Glass, MD [facility] 07/13/2020

## 2020-07-13 NOTE — ED Notes (Signed)
MRI/pt reports that she threw up while in MRI. Requesting more nausea medicine. Hopefully some of her pain meds got in her system before the emesis.

## 2020-07-13 NOTE — ED Triage Notes (Signed)
Pt reports that upon waking up at 0920 yesterday morning she had severe pain behind her R eye and difficulty with her vision. States she went to her eye doctor and had her eyes dilated, no obvious bleeding but wanted pt to come in for further eval to r/o stroke. Speech clear, face symmetrical, moves all limbs equally, denies dizziness.

## 2020-07-14 ENCOUNTER — Other Ambulatory Visit: Payer: Self-pay

## 2020-07-14 ENCOUNTER — Encounter (HOSPITAL_COMMUNITY): Payer: Self-pay | Admitting: Internal Medicine

## 2020-07-14 ENCOUNTER — Inpatient Hospital Stay (HOSPITAL_COMMUNITY): Payer: Medicare Other

## 2020-07-14 ENCOUNTER — Inpatient Hospital Stay (HOSPITAL_BASED_OUTPATIENT_CLINIC_OR_DEPARTMENT_OTHER): Payer: Medicare Other

## 2020-07-14 DIAGNOSIS — I371 Nonrheumatic pulmonary valve insufficiency: Secondary | ICD-10-CM

## 2020-07-14 DIAGNOSIS — I361 Nonrheumatic tricuspid (valve) insufficiency: Secondary | ICD-10-CM | POA: Diagnosis not present

## 2020-07-14 DIAGNOSIS — I639 Cerebral infarction, unspecified: Secondary | ICD-10-CM | POA: Diagnosis not present

## 2020-07-14 LAB — CBC
HCT: 42.2 % (ref 36.0–46.0)
Hemoglobin: 14 g/dL (ref 12.0–15.0)
MCH: 29.9 pg (ref 26.0–34.0)
MCHC: 33.2 g/dL (ref 30.0–36.0)
MCV: 90.2 fL (ref 80.0–100.0)
Platelets: 225 10*3/uL (ref 150–400)
RBC: 4.68 MIL/uL (ref 3.87–5.11)
RDW: 14 % (ref 11.5–15.5)
WBC: 11.2 10*3/uL — ABNORMAL HIGH (ref 4.0–10.5)
nRBC: 0 % (ref 0.0–0.2)

## 2020-07-14 LAB — BASIC METABOLIC PANEL
Anion gap: 12 (ref 5–15)
BUN: 11 mg/dL (ref 8–23)
CO2: 29 mmol/L (ref 22–32)
Calcium: 9.5 mg/dL (ref 8.9–10.3)
Chloride: 95 mmol/L — ABNORMAL LOW (ref 98–111)
Creatinine, Ser: 1.09 mg/dL — ABNORMAL HIGH (ref 0.44–1.00)
GFR, Estimated: 55 mL/min — ABNORMAL LOW (ref >60)
Glucose, Bld: 150 mg/dL — ABNORMAL HIGH (ref 70–99)
Potassium: 3.4 mmol/L — ABNORMAL LOW (ref 3.5–5.1)
Sodium: 136 mmol/L (ref 135–145)

## 2020-07-14 LAB — ECHOCARDIOGRAM COMPLETE BUBBLE STUDY
Area-P 1/2: 5.62 cm2
Calc EF: 60.5 %
S' Lateral: 3.12 cm
Single Plane A2C EF: 45.7 %
Single Plane A4C EF: 70.2 %

## 2020-07-14 LAB — LIPID PANEL
Cholesterol: 160 mg/dL (ref 0–200)
HDL: 28 mg/dL — ABNORMAL LOW (ref >40)
LDL Cholesterol: 106 mg/dL — ABNORMAL HIGH (ref 0–99)
Total CHOL/HDL Ratio: 5.7 RATIO
Triglycerides: 131 mg/dL (ref <150)
VLDL: 26 mg/dL (ref 0–40)

## 2020-07-14 LAB — TSH: TSH: 2.02 u[IU]/mL (ref 0.350–4.500)

## 2020-07-14 LAB — HIV ANTIBODY (ROUTINE TESTING W REFLEX): HIV Screen 4th Generation wRfx: NONREACTIVE

## 2020-07-14 LAB — HEMOGLOBIN A1C
Hgb A1c MFr Bld: 6.5 % — ABNORMAL HIGH (ref 4.8–5.6)
Mean Plasma Glucose: 139.85 mg/dL

## 2020-07-14 IMAGING — MR MR MRA HEAD W/O CM
2 series · 35 of 48 positions shown · IV contrast (gadavist)
Comparison: None.

CLINICAL DATA: Right PCA infarct.

EXAM:
MRA NECK WITHOUT AND WITH CONTRAST
MRA HEAD WITHOUT CONTRAST
TECHNIQUE: Multiplanar and multiecho pulse sequences of the neck were obtained
without and with intravenous contrast. Angiographic images of the
neck were obtained using MRA technique without and with intravenous
contast.; Angiographic images of the Circle of Willis were obtained
using MRA technique without intravenous contrast.
CONTRAST:  10mL GADAVIST GADOBUTROL 1 MMOL/ML IV SOLN

[Series 1: aahead_scout · sagittal · 1.6mm · 1.62mm/px · 20 of 128 slices shown]
[im 1/128]
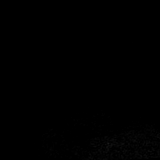
[im 7/128]
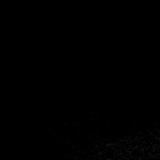
[im 14/128]
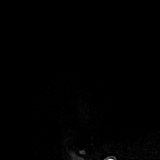
[im 21/128]
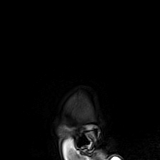
[im 27/128]
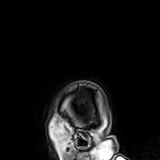
[im 34/128]
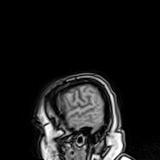
[im 41/128]
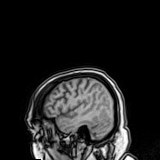
[im 47/128]
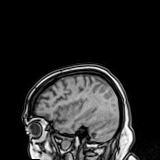
[im 54/128]
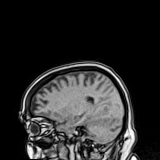
[im 61/128]
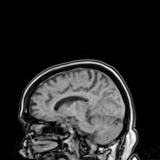
[im 67/128]
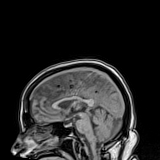
[im 74/128]
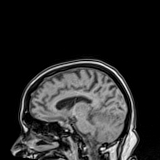
[im 81/128]
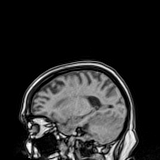
[im 87/128]
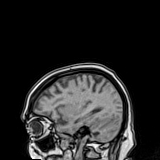
[im 94/128]
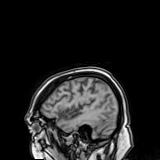
[im 101/128]
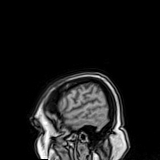
[im 107/128]
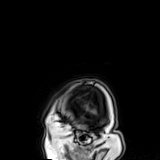
[im 114/128]
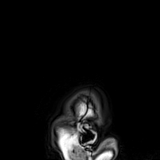
[im 121/128]
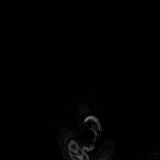
[im 128/128]
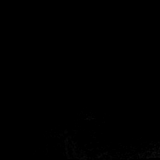

[Series 5: 3d cow · axial · 0.5mm · 0.41mm/px · z∈[-124,-43]mm · 15 of 172 slices shown]
[im 1/172]
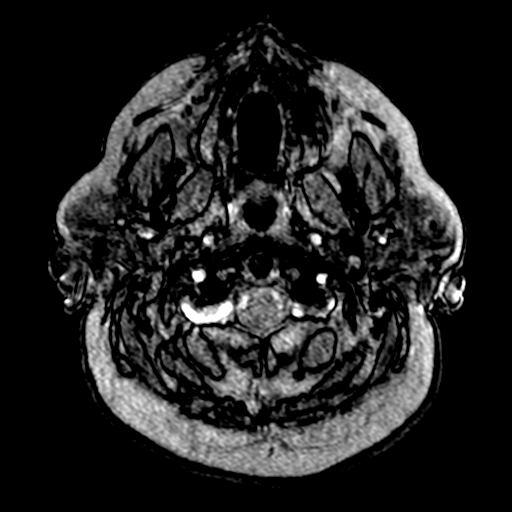
[im 7/172]
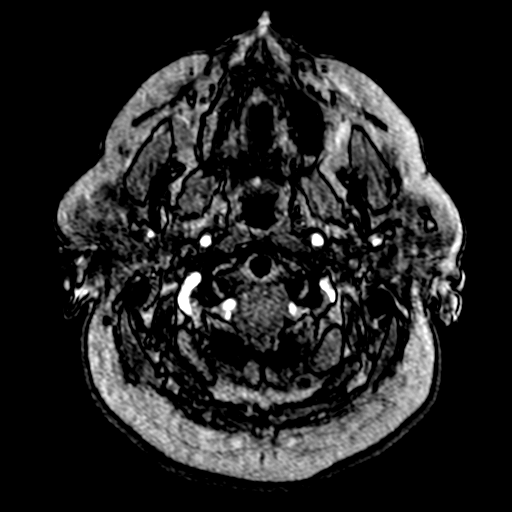
[im 13/172]
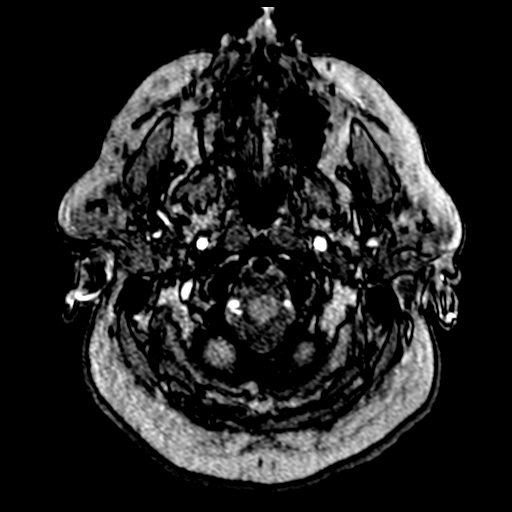
[im 20/172]
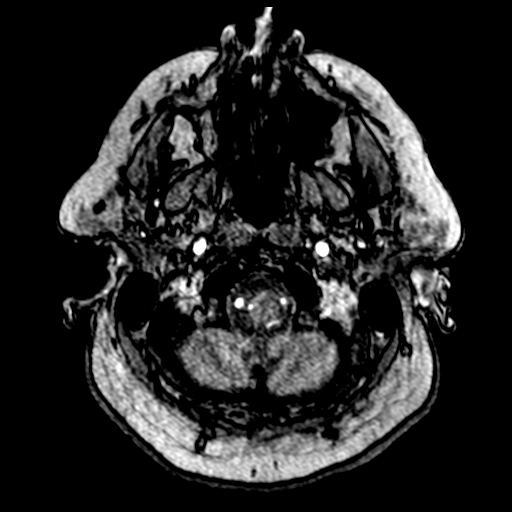
[im 26/172]
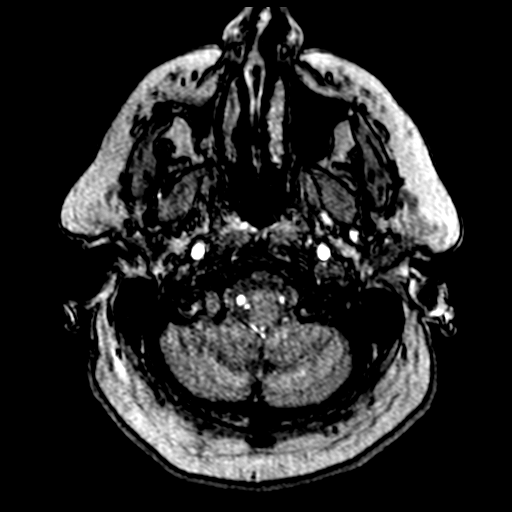
[im 32/172]
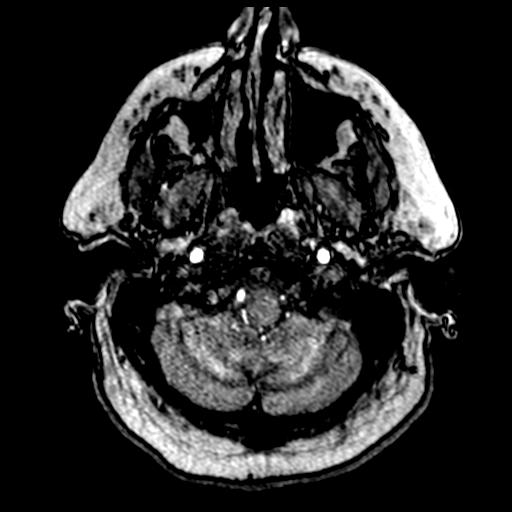
[im 39/172]
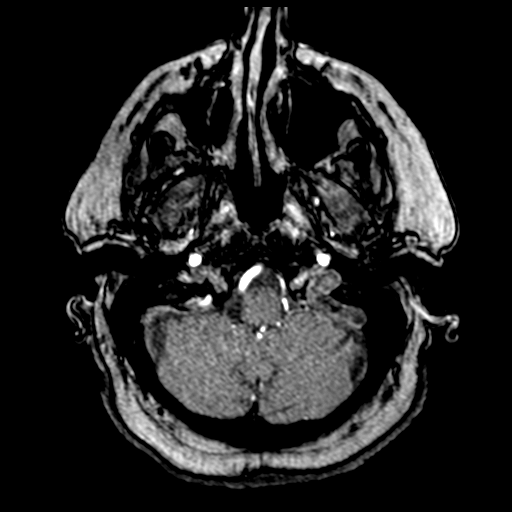
[im 51/172]
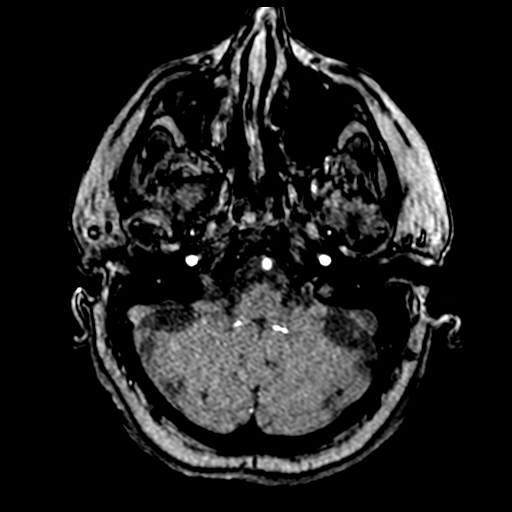
[im 77/172]
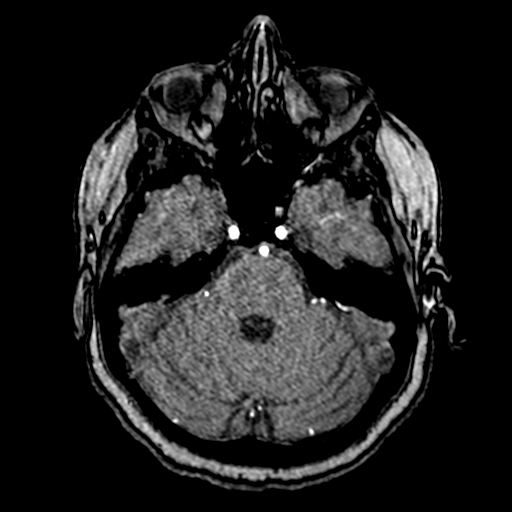
[im 89/172]
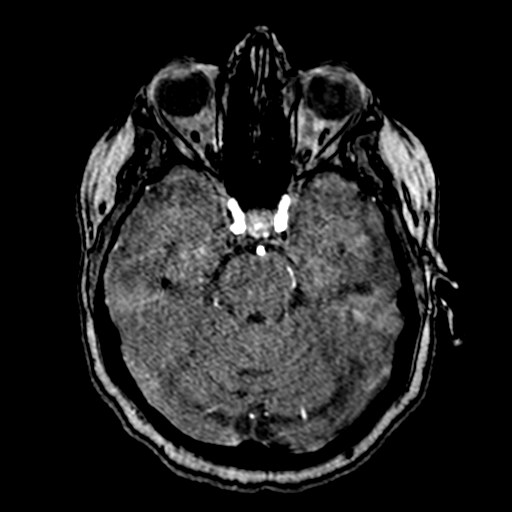
[im 96/172]
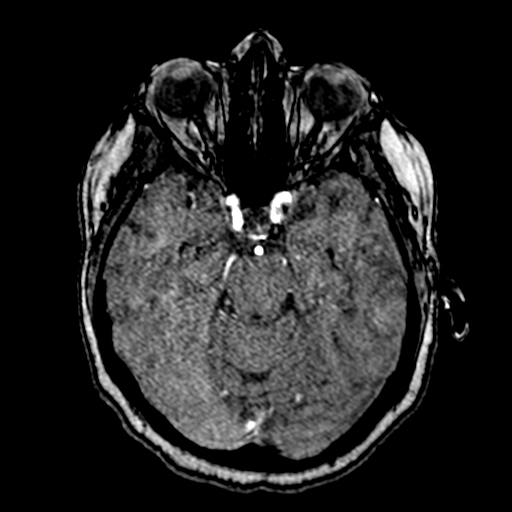
[im 121/172]
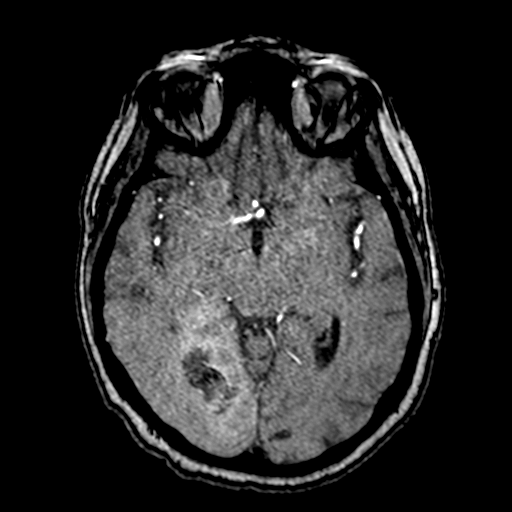
[im 140/172]
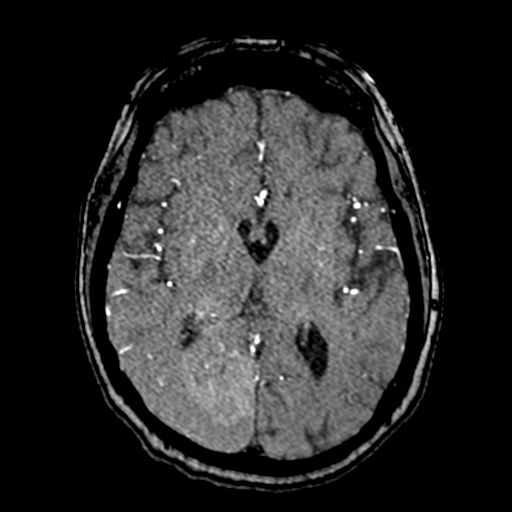
[im 146/172]
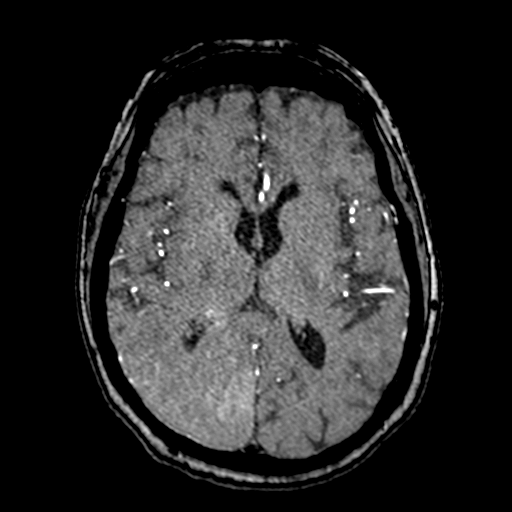
[im 165/172]
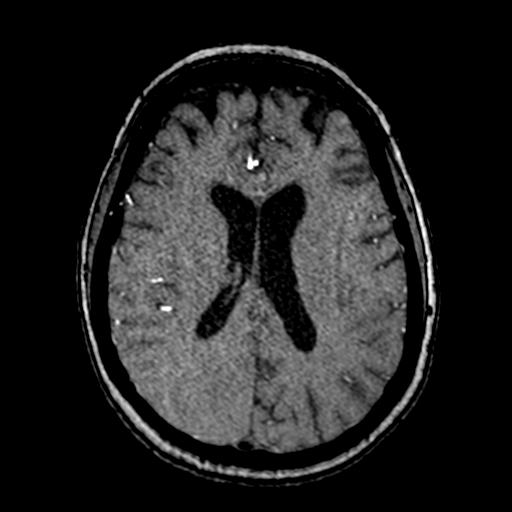

[35 of 48 positions shown; findings below may reference images not displayed]

FINDINGS: MRA NECK FINDINGS

There is a normal variant 4 vessel aortic arch with the left
vertebral artery arising directly from the arch. The brachiocephalic
and subclavian arteries are widely patent.

The common carotid and cervical internal carotid arteries are patent
without evidence of a dissection or definite significant stenosis.
Motion artifact mildly limits assessment of the proximal right ICA.

The vertebral arteries are patent with antegrade flow bilaterally.
There is no evidence of significant stenosis or dissection involving
the right vertebral artery which is strongly dominant. Assessment of
the proximal left V1 segment is limited by motion artifact and the
small size of the vessel. There is no evidence of a significant
stenosis or dissection involving the left vertebral artery more
distally.

A few soft tissue nodules in the right upper neck located inferior
to the right parotid tail likely represent lymph nodes and measure
up to 9 mm in short axis, nonspecific.

MRA HEAD FINDINGS

The intracranial vertebral arteries are widely patent to the basilar
with the right being strongly dominant. Patent PICAs and SCAs are
seen bilaterally. The basilar artery is widely patent. Posterior
communicating arteries are not clearly identified and may be
diminutive or absent. There is attenuation of distal right PCA
branch vessels without evidence of a flow limiting proximal right
PCA stenosis. There is a moderate mid left P2 stenosis.

The internal carotid arteries are widely patent from skull base to
carotid termini. ACAs and MCAs are patent without evidence of a
proximal branch occlusion or significant proximal stenosis. No
aneurysm is identified.
IMPRESSION: 1. Attenuation of distal right PCA branch vessels without evidence
of a flow limiting proximal stenosis or large vessel occlusion.
2. Moderate left P2 stenosis.
3. Patent cervical carotid and vertebral arteries without evidence
of significant stenosis allowing for artifact through the proximal
right ICA and proximal left vertebral artery.

## 2020-07-14 IMAGING — CT CT HEAD W/O CM
3 series · 15 of 47 positions shown, 18 images · non-contrast
Comparison: Head CT and MRI [DATE]

CLINICAL DATA: Headache.  Follow-up hemorrhagic right PCA infarct.

EXAM:
CT HEAD WITHOUT CONTRAST
TECHNIQUE: Contiguous axial images were obtained from the base of the skull
through the vertex without intravenous contrast.

[Series 3: head 5.0 h30s · axial · 0.39mm/px · z∈[-133,-8]mm · 9 of 31 slices shown, 12 images]
[im 3/31  brain]
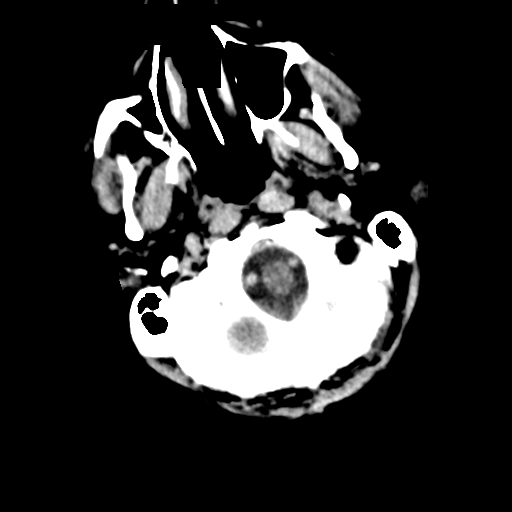
[im 3/31  bone]
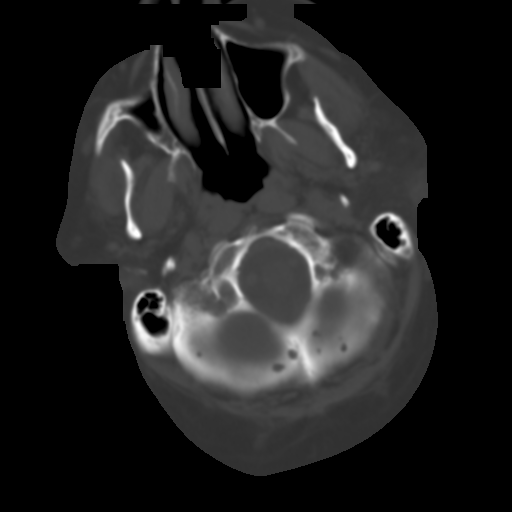
[im 6/31  brain]
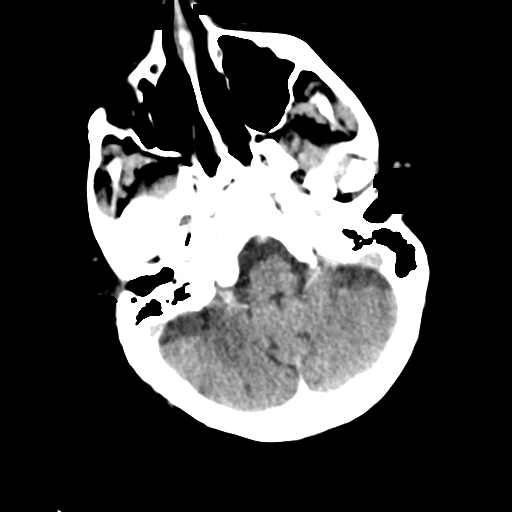
[im 9/31  brain]
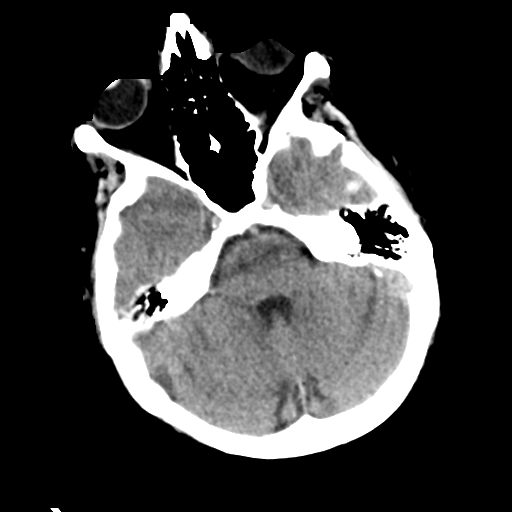
[im 12/31  brain]
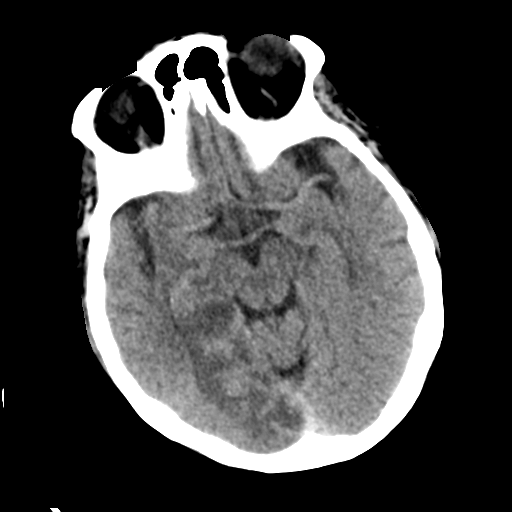
[im 16/31  brain]
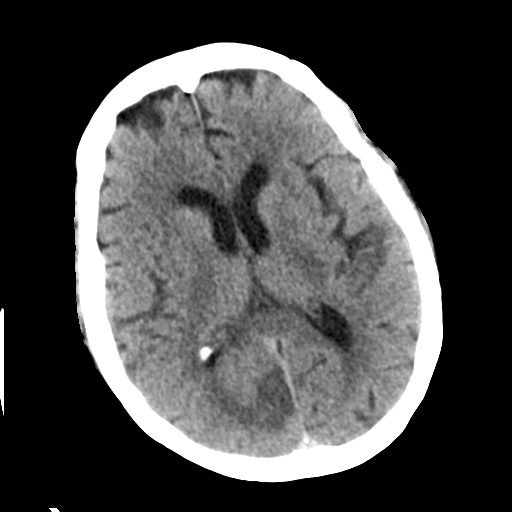
[im 16/31  bone]
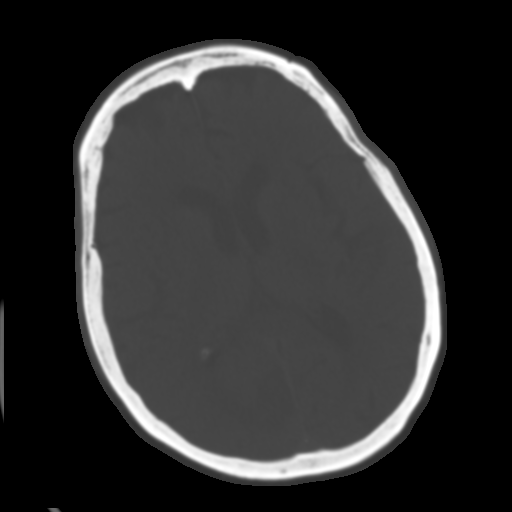
[im 19/31  brain]
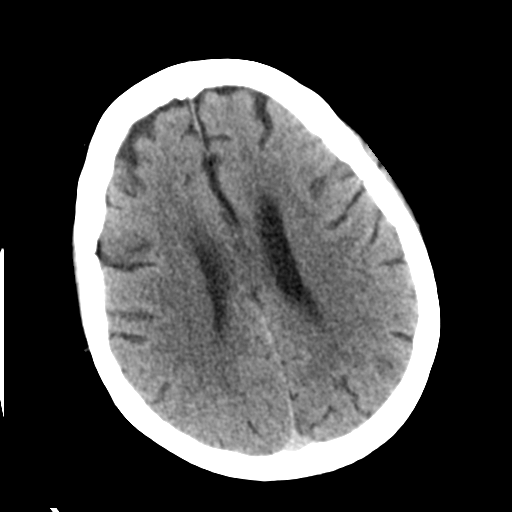
[im 22/31  brain]
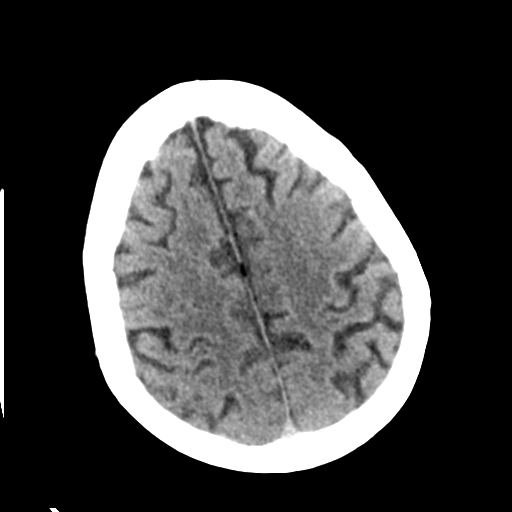
[im 25/31  brain]
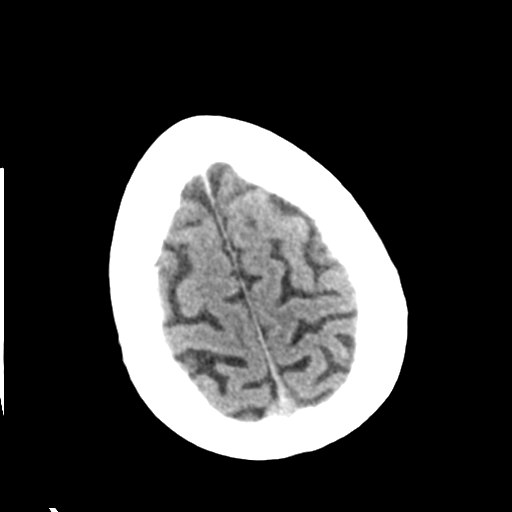
[im 28/31  brain]
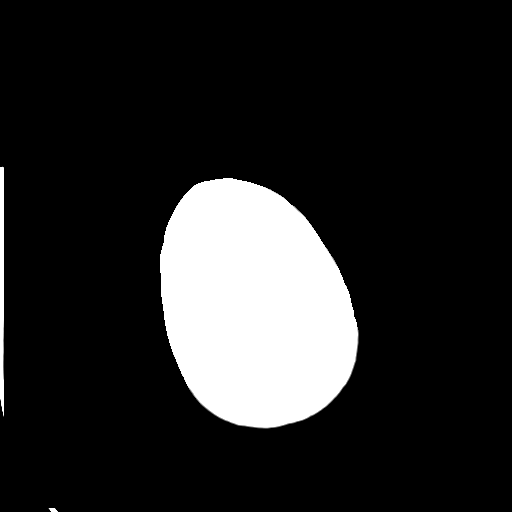
[im 28/31  bone]
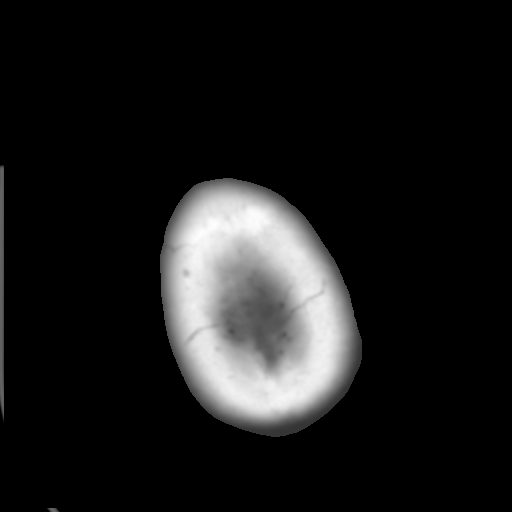

[Series 5: head 3.0 mpr cor · coronal · 0.27mm/px · 3 of 65 slices shown]
[im 22/65  brain]
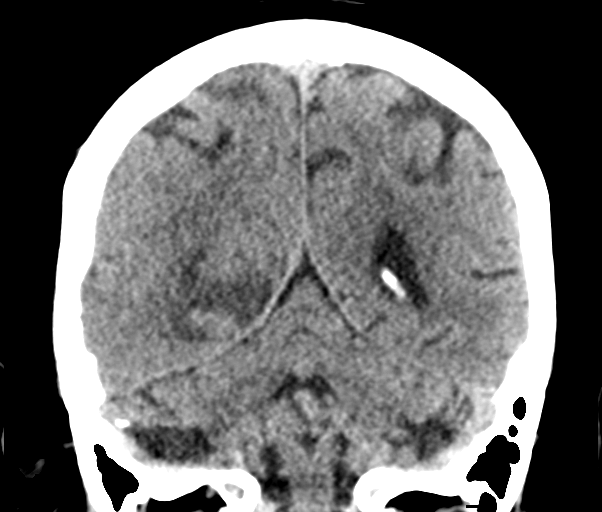
[im 29/65  brain]
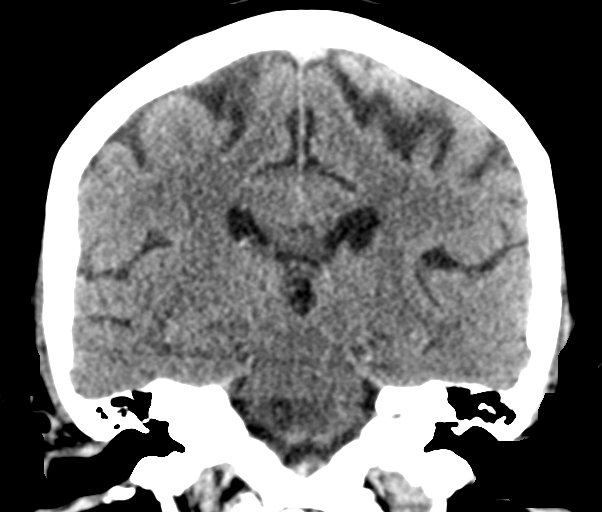
[im 36/65  brain]
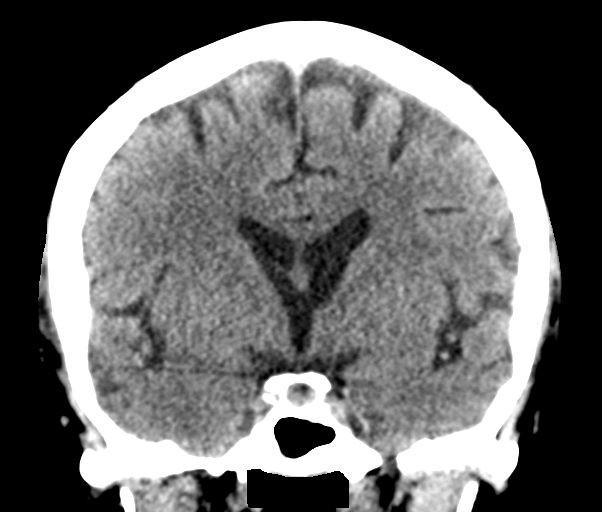

[Series 6: head 3.0 mpr sag · sagittal · 0.29mm/px · 3 of 54 slices shown]
[im 19/54  brain]
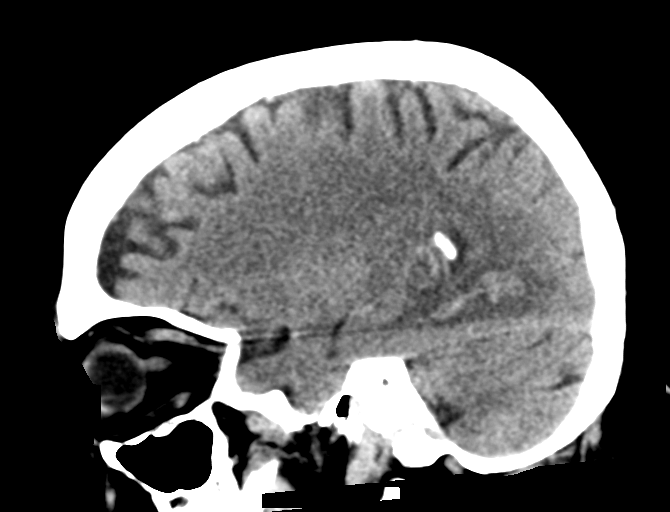
[im 27/54  brain]
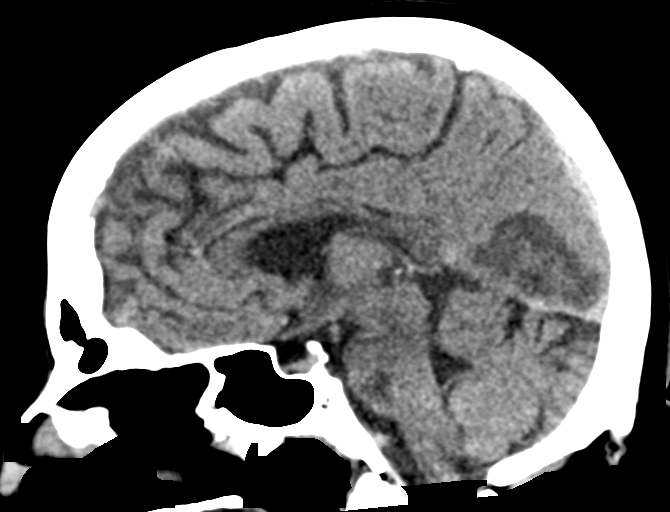
[im 35/54  brain]
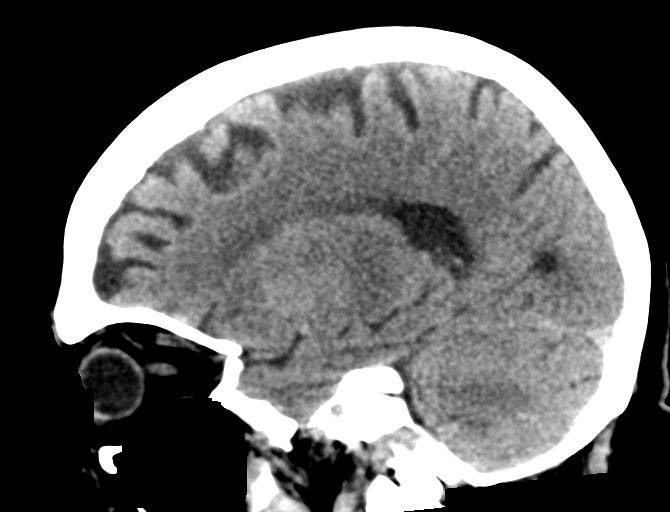

[15 of 47 positions shown; findings below may reference images not displayed]

FINDINGS: Brain: A large right PCA infarct is again seen with unchanged
hemorrhage including a 3.0 x 1.3 cm region of hemorrhage in the
right occipital lobe. Cytotoxic edema and local mass effect are
unchanged. There is no midline shift. No new infarct, new
intracranial hemorrhage, or extra-axial fluid collection is
identified. A small chronic right cerebellar infarct is again noted.
The ventricles are normal in size aside from partial effacement of
the right lateral ventricle in the region of the infarct.

Vascular: Calcified atherosclerosis at the skull base.

Skull: No fracture or suspicious osseous lesion.

Sinuses/Orbits: Small right maxillary sinus. Clear mastoid air
cells. Unremarkable orbits.

Other: None.
IMPRESSION: 1. Right PCA infarct with unchanged hemorrhage.
2. No evidence of new intracranial abnormality.

## 2020-07-14 IMAGING — MR MR MRA NECK WO/W CM
8 of 11 series · 40 of 48 positions shown · IV contrast (gadavist)
Comparison: None.

CLINICAL DATA: Right PCA infarct.

EXAM:
MRA NECK WITHOUT AND WITH CONTRAST
MRA HEAD WITHOUT CONTRAST
TECHNIQUE: Multiplanar and multiecho pulse sequences of the neck were obtained
without and with intravenous contrast. Angiographic images of the
neck were obtained using MRA technique without and with intravenous
contast.; Angiographic images of the Circle of Willis were obtained
using MRA technique without intravenous contrast.
CONTRAST:  10mL GADAVIST GADOBUTROL 1 MMOL/ML IV SOLN

[Series 16: tof_fl3d_tra_iso · axial · 0.6mm · 0.52mm/px · z∈[-204,-126]mm · 7 of 133 slices shown]
[im 1/133]
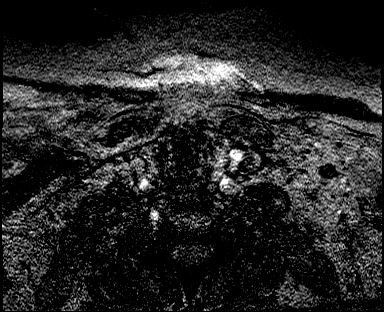
[im 23/133]
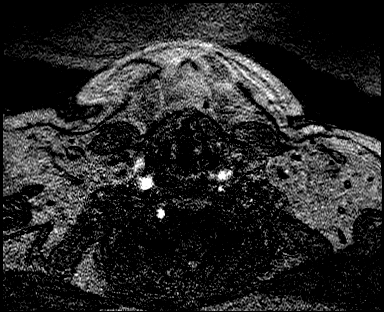
[im 45/133]
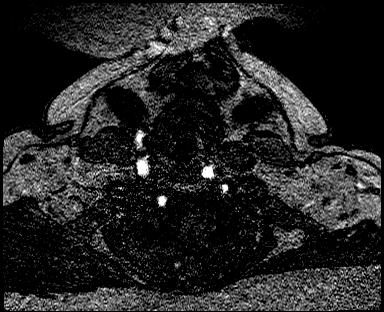
[im 67/133]
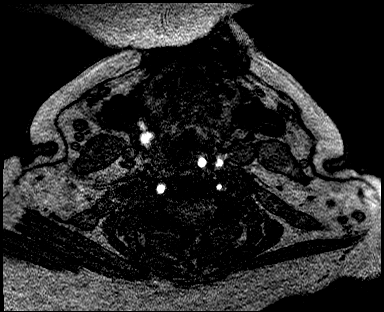
[im 89/133]
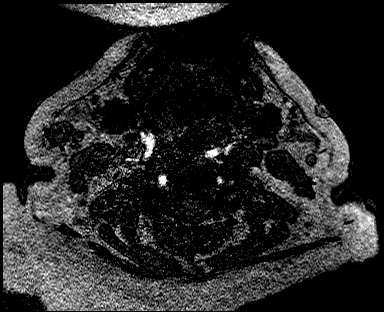
[im 111/133]
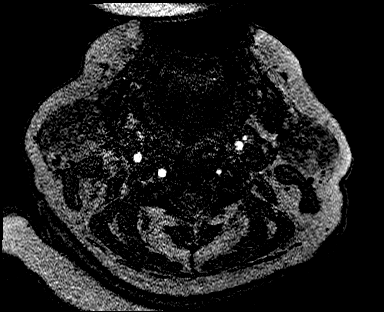
[im 133/133]
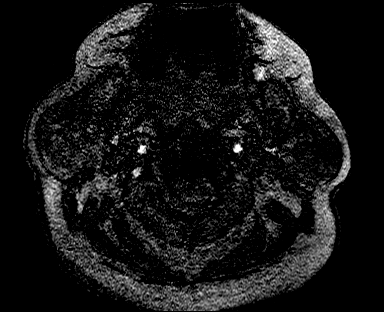

[Series 19: angio_fl3d_cor_pre_ttc=3.0s · coronal · 0.9mm · 0.85mm/px · 4 of 80 slices shown]
[im 1/80]
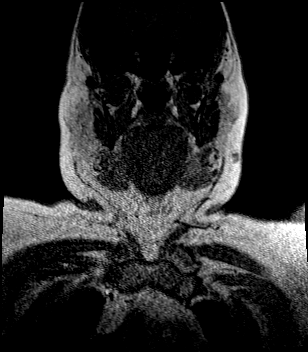
[im 27/80]
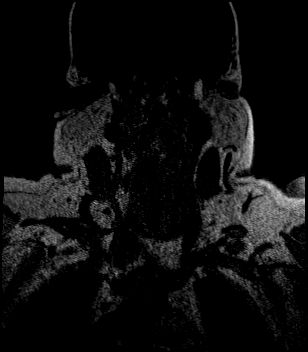
[im 53/80]
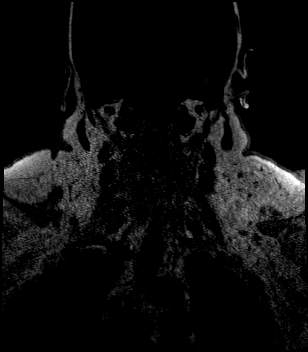
[im 80/80]
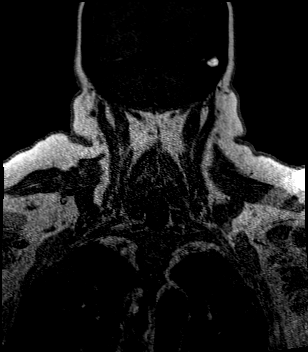

[Series 21: angio_fl3d_cor_post_ttc=3.0s · coronal · 0.9mm · 0.85mm/px · 5 of 80 slices shown (1 of 2)]
[im 1/80]
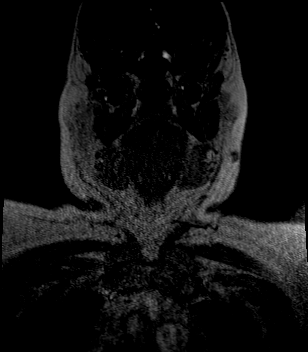
[im 20/80]
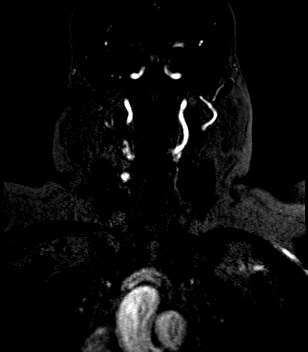
[im 40/80]
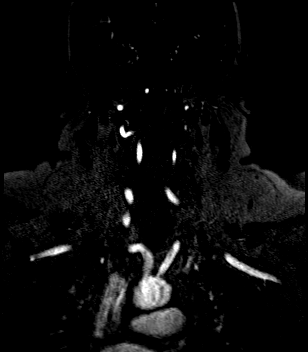
[im 60/80]
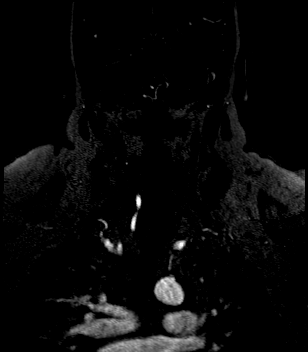
[im 80/80]
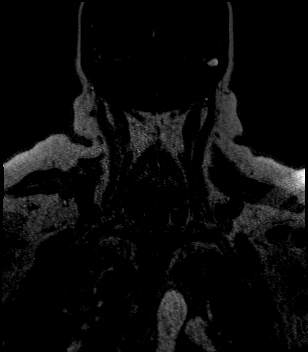

[Series 22: angio_fl3d_cor_post_ttc=3.0s_moco-adv · coronal · 0.9mm · 0.85mm/px · 5 of 80 slices shown (1 of 2)]
[im 1/80]
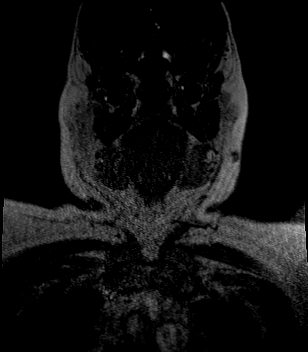
[im 20/80]
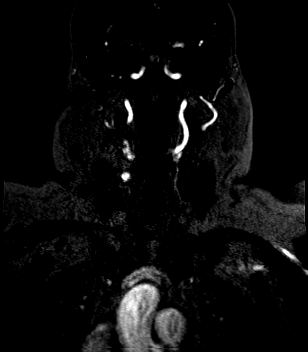
[im 40/80]
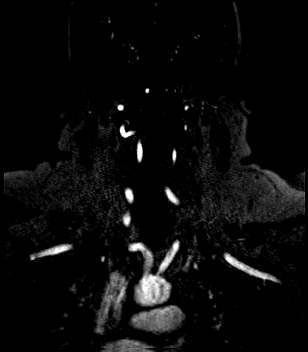
[im 60/80]
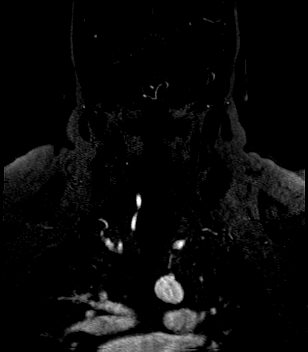
[im 80/80]
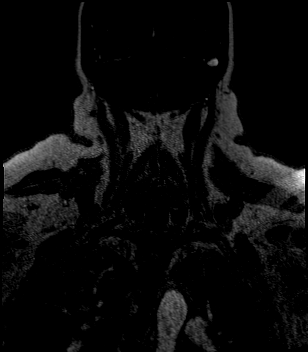

[Series 23: angio_fl3d_cor_post_ttc=3.0s_moco-adv_sub · coronal · 0.9mm · 0.85mm/px · 5 of 80 slices shown (1 of 2)]
[im 1/80]
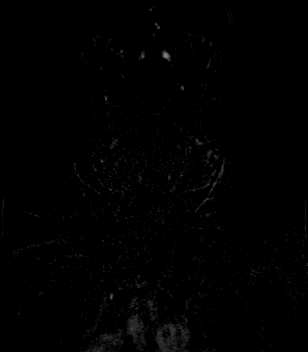
[im 20/80]
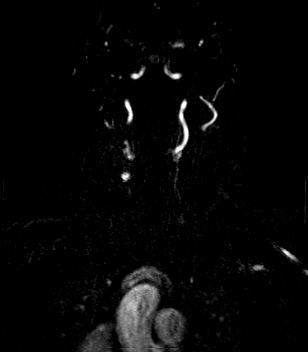
[im 40/80]
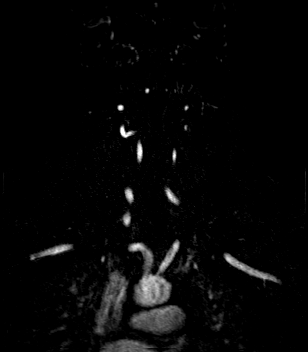
[im 60/80]
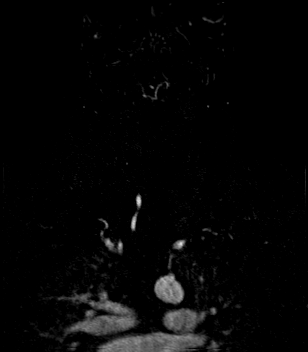
[im 80/80]
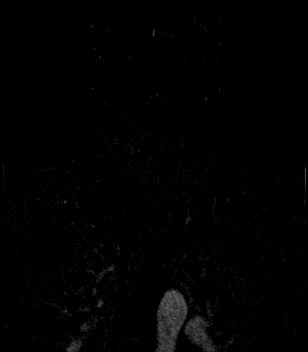

[Series 25: angio_fl3d_cor_post_ttc=3.0s · coronal · 0.9mm · 0.85mm/px · 5 of 78 slices shown (2 of 2)]
[im 1/78]
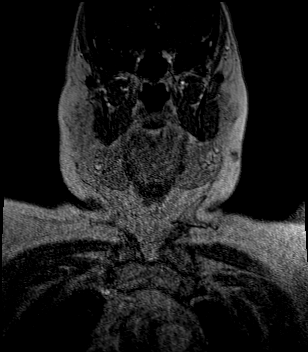
[im 20/78]
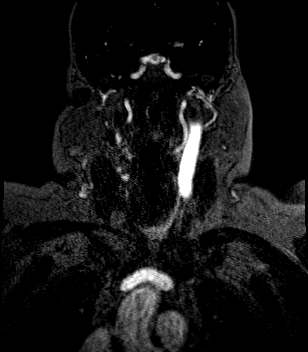
[im 39/78]
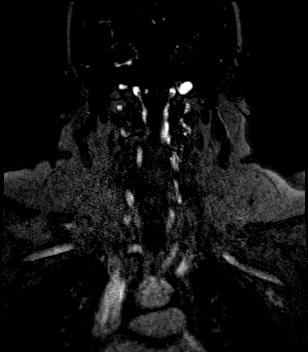
[im 58/78]
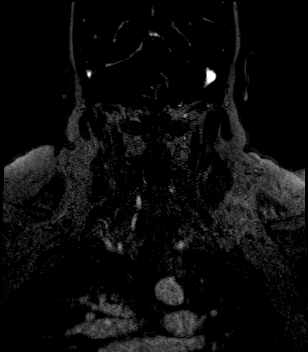
[im 78/78]
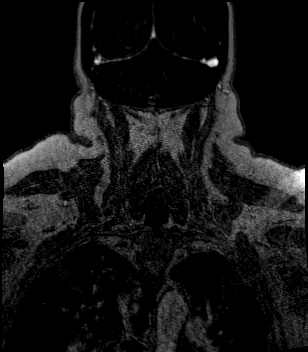

[Series 26: angio_fl3d_cor_post_ttc=3.0s_moco-adv · coronal · 0.9mm · 0.85mm/px · 5 of 77 slices shown (2 of 2)]
[im 1/77]
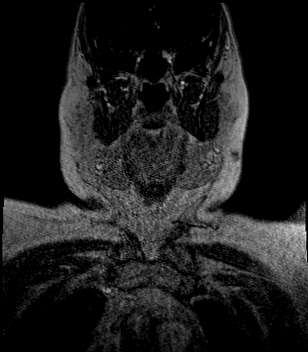
[im 20/77]
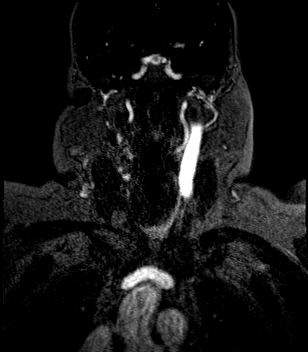
[im 39/77]
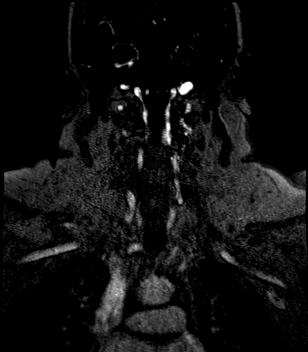
[im 58/77]
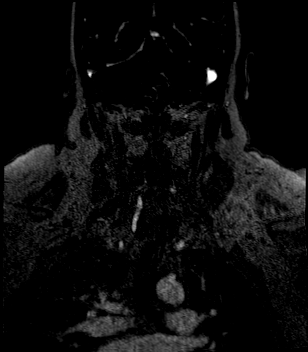
[im 77/77]
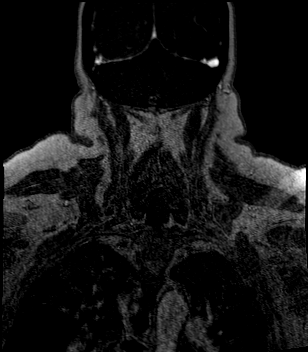

[Series 27: angio_fl3d_cor_post_ttc=3.0s_moco-adv_sub · coronal · 0.9mm · 0.85mm/px · 4 of 71 slices shown (2 of 2)]
[im 1/71]
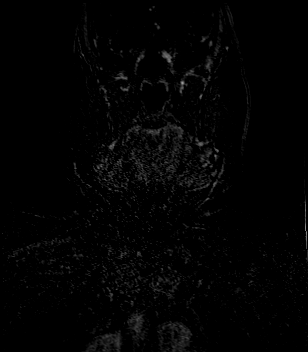
[im 24/71]
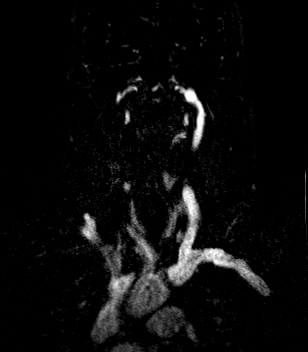
[im 47/71]
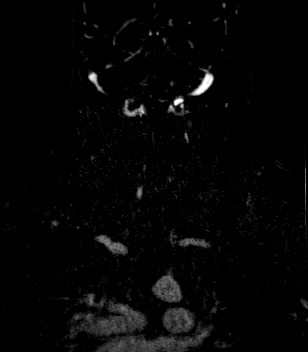
[im 71/71]
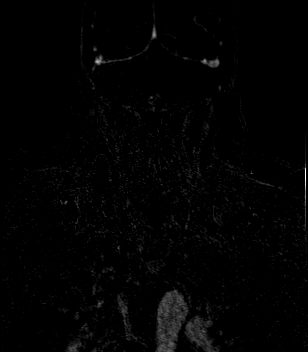

[40 of 48 positions shown; findings below may reference images not displayed]

FINDINGS: MRA NECK FINDINGS

There is a normal variant 4 vessel aortic arch with the left
vertebral artery arising directly from the arch. The brachiocephalic
and subclavian arteries are widely patent.

The common carotid and cervical internal carotid arteries are patent
without evidence of a dissection or definite significant stenosis.
Motion artifact mildly limits assessment of the proximal right ICA.

The vertebral arteries are patent with antegrade flow bilaterally.
There is no evidence of significant stenosis or dissection involving
the right vertebral artery which is strongly dominant. Assessment of
the proximal left V1 segment is limited by motion artifact and the
small size of the vessel. There is no evidence of a significant
stenosis or dissection involving the left vertebral artery more
distally.

A few soft tissue nodules in the right upper neck located inferior
to the right parotid tail likely represent lymph nodes and measure
up to 9 mm in short axis, nonspecific.

MRA HEAD FINDINGS

The intracranial vertebral arteries are widely patent to the basilar
with the right being strongly dominant. Patent PICAs and SCAs are
seen bilaterally. The basilar artery is widely patent. Posterior
communicating arteries are not clearly identified and may be
diminutive or absent. There is attenuation of distal right PCA
branch vessels without evidence of a flow limiting proximal right
PCA stenosis. There is a moderate mid left P2 stenosis.

The internal carotid arteries are widely patent from skull base to
carotid termini. ACAs and MCAs are patent without evidence of a
proximal branch occlusion or significant proximal stenosis. No
aneurysm is identified.
IMPRESSION: 1. Attenuation of distal right PCA branch vessels without evidence
of a flow limiting proximal stenosis or large vessel occlusion.
2. Moderate left P2 stenosis.
3. Patent cervical carotid and vertebral arteries without evidence
of significant stenosis allowing for artifact through the proximal
right ICA and proximal left vertebral artery.

## 2020-07-14 MED ORDER — DILTIAZEM HCL ER COATED BEADS 180 MG PO CP24
180.0000 mg | ORAL_CAPSULE | Freq: Every day | ORAL | Status: DC
  Administered 2020-07-14: 180 mg via ORAL
  Filled 2020-07-14: qty 1

## 2020-07-14 MED ORDER — ALBUTEROL SULFATE (2.5 MG/3ML) 0.083% IN NEBU: 2.5000 mg | INHALATION_SOLUTION | RESPIRATORY_TRACT | Status: DC | PRN

## 2020-07-14 MED ORDER — GADOBUTROL 1 MMOL/ML IV SOLN
10.0000 mL | Freq: Once | INTRAVENOUS | Status: AC | PRN
  Administered 2020-07-14: 10 mL via INTRAVENOUS

## 2020-07-14 MED ORDER — SODIUM CHLORIDE 0.9% FLUSH
9.0000 mL | Freq: Once | INTRAVENOUS | Status: AC
  Administered 2020-07-14: 9 mL via INTRAVENOUS

## 2020-07-14 MED ORDER — TRAMADOL HCL 50 MG PO TABS: 50.0000 mg | ORAL_TABLET | Freq: Four times a day (QID) | ORAL | 0 refills | Status: AC | PRN

## 2020-07-14 MED ORDER — ASPIRIN EC 81 MG PO TBEC: 81.0000 mg | DELAYED_RELEASE_TABLET | Freq: Every day | ORAL | 2 refills | Status: DC

## 2020-07-14 MED ORDER — DICYCLOMINE HCL 20 MG PO TABS
20.0000 mg | ORAL_TABLET | Freq: Three times a day (TID) | ORAL | Status: DC
  Administered 2020-07-14 (×2): 20 mg via ORAL
  Filled 2020-07-14 (×4): qty 1

## 2020-07-14 MED ORDER — TRAMADOL HCL 50 MG PO TABS: 50.0000 mg | ORAL_TABLET | Freq: Four times a day (QID) | ORAL | Status: DC | PRN

## 2020-07-14 MED ORDER — PROMETHAZINE HCL 25 MG/ML IJ SOLN
12.5000 mg | Freq: Four times a day (QID) | INTRAMUSCULAR | Status: DC | PRN
  Administered 2020-07-14: 12.5 mg via INTRAVENOUS
  Filled 2020-07-14: qty 1

## 2020-07-14 MED ORDER — PERFLUTREN LIPID MICROSPHERE
1.0000 mL | INTRAVENOUS | Status: AC | PRN
  Administered 2020-07-14: 2 mL via INTRAVENOUS
  Filled 2020-07-14: qty 10

## 2020-07-14 NOTE — Progress Notes (Signed)
Inpatient Rehabilitation Admissions Coordinator  I met at bedside with patient and her spouse. I discussed a possible [facility] admit , but patient is adamant that she prefers to d/c home with [facility] or outpatient follow up.   Danne Baxter, RN, MSN Rehab Admissions Coordinator [phone] 07/14/2020 12:01 PM

## 2020-07-14 NOTE — Progress Notes (Signed)
Pt taken to MRI, still alert orienetd no complain of pain  Nor N/V

## 2020-07-14 NOTE — Care Management CC44 (Signed)
Condition Code 44 Documentation Completed  Patient Details  Name: Samantha Klein MRN: [id number] Date of Birth: 05/20/1951   Condition Code 44 given:  Yes Patient signature on Condition Code 44 notice:  Yes Documentation of 2 MD's agreement:  Yes Code 44 added to claim:  Yes    Kermit Balo, RN 07/14/2020, 4:10 PM

## 2020-07-14 NOTE — TOC Transition Note (Signed)
Transition of Care [facility]) - CM/SW Discharge Note   Patient Details  Name: Samantha Klein MRN: [id number] Date of Birth: Sep 22, 1950  Transition of Care [facility]) CM/SW Contact:  Kermit Balo, RN Phone Number: 07/14/2020, 4:14 PM   Clinical Narrative:    Pt is discharging home with self care. Recommendations are for CIR or outpatient therapy and pt is refusing both. No DME needs.  Pt doesn't have an active PCP as her retired but her info was sent to Dr Ralph Leyden with [facility]. She will f/u with the office for a hospital f/u appt.  Pt has supervision at home and transport to home.   Final next level of care: Home/Self Care Barriers to Discharge: No Barriers Identified   Patient Goals and CMS Choice     Choice offered to / list presented to : Patient  Discharge Placement                       Discharge Plan and Services                                     Social Determinants of Health (SDOH) Interventions     Readmission Risk Interventions No flowsheet data found.

## 2020-07-14 NOTE — Progress Notes (Addendum)
STROKE TEAM PROGRESS NOTE   INTERVAL HISTORY *I have personally reviewed history of presenting illness with the patient, electronic medical records and imaging films in PACS.  She presented with left-sided vision loss and nausea vomiting and MRI scan confirms right PCA infarct with some hemorrhagic transformation and with MRA showing distal right PCA occlusion.  MRA of the neck is unremarkable.  LDL cholesterol is elevated at 106 mg percent.  Echocardiogram and hemoglobin A1c are pending.  Patient has history of atrial fibrillation but had not been on anticoagulation. She states this morning left-sided peripheral vision loss has persisted.  No nausea vomiting is improved vital signs are stable  Follow-up CT scan of the head this morning shows persistent hemorrhagic right PCA infarct without increase or decrease in the hemorrhage. Vitals:   07/13/20 2225 07/14/20 0032 07/14/20 0414 07/14/20 0754  BP: (!) 150/92 (!) 172/85 101/62 (!) 163/107  Pulse: (!) 109 (!) 107  88  Resp: 18 19 18 16   Temp: 98.4 F (36.9 C) 98.5 F (36.9 C) 99.2 F (37.3 C) 98.3 F (36.8 C)  TempSrc: Oral Oral Axillary Oral  SpO2: 97% 95% 94% 90%  Weight: 100.2 kg     Height: 5\' 7"  (1.702 m)      CBC:  Recent Labs  Lab 07/13/20 1239 07/13/20 1239 07/13/20 1352 07/14/20 0416  WBC 10.1  --   --  11.2*  NEUTROABS 8.3*  --   --   --   HGB 15.6*   < > 16.3* 14.0  HCT 46.5*   < > 48.0* 42.2  MCV 90.8  --   --  90.2  PLT 255  --   --  225   < > = values in this interval not displayed.   Basic Metabolic Panel:  Recent Labs  Lab 07/13/20 1239 07/13/20 1239 07/13/20 1352 07/14/20 0416  NA 138   < > 139 136  K 3.3*   < > 3.2* 3.4*  CL 93*   < > 94* 95*  CO2 28  --   --  29  GLUCOSE 154*   < > 150* 150*  BUN 8   < > 8 11  CREATININE 0.98   < > 0.90 1.09*  CALCIUM 9.8  --   --  9.5   < > = values in this interval not displayed.   Lipid Panel:  Recent Labs  Lab 07/14/20 0416  CHOL 160  TRIG 131  HDL 28*   CHOLHDL 5.7  VLDL 26  LDLCALC 07/16/20*   HgbA1c: No results for input(s): HGBA1C in the last 168 hours. Urine Drug Screen: No results for input(s): LABOPIA, COCAINSCRNUR, LABBENZ, AMPHETMU, THCU, LABBARB in the last 168 hours.  Alcohol Level No results for input(s): ETH in the last 168 hours.  IMAGING past 24 hours CT HEAD WO CONTRAST  Result Date: 07/14/2020 CLINICAL DATA:  Headache.  Follow-up hemorrhagic right PCA infarct. EXAM: CT HEAD WITHOUT CONTRAST TECHNIQUE: Contiguous axial images were obtained from the base of the skull through the vertex without intravenous contrast. COMPARISON:  Head CT and MRI 07/13/2020 FINDINGS: Brain: A large right PCA infarct is again seen with unchanged hemorrhage including a 3.0 x 1.3 cm region of hemorrhage in the right occipital lobe. Cytotoxic edema and local mass effect are unchanged. There is no midline shift. No new infarct, new intracranial hemorrhage, or extra-axial fluid collection is identified. A small chronic right cerebellar infarct is again noted. The ventricles are normal in size aside from  partial effacement of the right lateral ventricle in the region of the infarct. Vascular: Calcified atherosclerosis at the skull base. Skull: No fracture or suspicious osseous lesion. Sinuses/Orbits: Small right maxillary sinus. Clear mastoid air cells. Unremarkable orbits. Other: None. IMPRESSION: 1. Right PCA infarct with unchanged hemorrhage. 2. No evidence of new intracranial abnormality. Electronically Signed   By: Sebastian Ache M.D.   On: 07/14/2020 06:36   CT HEAD WO CONTRAST  Result Date: 07/13/2020 CLINICAL DATA:  Right-sided vision loss. EXAM: CT HEAD WITHOUT CONTRAST TECHNIQUE: Contiguous axial images were obtained from the base of the skull through the vertex without intravenous contrast. COMPARISON:  None. FINDINGS: Brain: 1.7 x 1.3 cm lobulated abnormality is seen in the right occipital lobe with surrounding white matter edema most consistent with  neoplasm or malignancy. Acute infarction cannot be excluded. MRI is recommended for further evaluation. Mild chronic ischemic white matter disease is noted. Ventricular size is within normal limits. No midline shift is noted. No definite hemorrhage is noted. Vascular: No hyperdense vessel or unexpected calcification. Skull: Normal. Negative for fracture or focal lesion. Sinuses/Orbits: No acute finding. Other: None. IMPRESSION: 1.7 x 1.3 cm lobulated abnormality is seen in the right occipital lobe with surrounding white matter edema most consistent with neoplasm or malignancy. Acute infarction cannot be excluded. MRI with and without gadolinium is recommended for further evaluation. Electronically Signed   By: Lupita Raider M.D.   On: 07/13/2020 13:54   MR ANGIO HEAD WO CONTRAST  Result Date: 07/14/2020 CLINICAL DATA:  Right PCA infarct. EXAM: MRA NECK WITHOUT AND WITH CONTRAST MRA HEAD WITHOUT CONTRAST TECHNIQUE: Multiplanar and multiecho pulse sequences of the neck were obtained without and with intravenous contrast. Angiographic images of the neck were obtained using MRA technique without and with intravenous contast.; Angiographic images of the Circle of Willis were obtained using MRA technique without intravenous contrast. CONTRAST:  60mL GADAVIST GADOBUTROL 1 MMOL/ML IV SOLN COMPARISON:  None. FINDINGS: MRA NECK FINDINGS There is a normal variant 4 vessel aortic arch with the left vertebral artery arising directly from the arch. The brachiocephalic and subclavian arteries are widely patent. The common carotid and cervical internal carotid arteries are patent without evidence of a dissection or definite significant stenosis. Motion artifact mildly limits assessment of the proximal right ICA. The vertebral arteries are patent with antegrade flow bilaterally. There is no evidence of significant stenosis or dissection involving the right vertebral artery which is strongly dominant. Assessment of the  proximal left V1 segment is limited by motion artifact and the small size of the vessel. There is no evidence of a significant stenosis or dissection involving the left vertebral artery more distally. A few soft tissue nodules in the right upper neck located inferior to the right parotid tail likely represent lymph nodes and measure up to 9 mm in short axis, nonspecific. MRA HEAD FINDINGS The intracranial vertebral arteries are widely patent to the basilar with the right being strongly dominant. Patent PICAs and SCAs are seen bilaterally. The basilar artery is widely patent. Posterior communicating arteries are not clearly identified and may be diminutive or absent. There is attenuation of distal right PCA branch vessels without evidence of a flow limiting proximal right PCA stenosis. There is a moderate mid left P2 stenosis. The internal carotid arteries are widely patent from skull base to carotid termini. ACAs and MCAs are patent without evidence of a proximal branch occlusion or significant proximal stenosis. No aneurysm is identified. IMPRESSION: 1. Attenuation of distal  right PCA branch vessels without evidence of a flow limiting proximal stenosis or large vessel occlusion. 2. Moderate left P2 stenosis. 3. Patent cervical carotid and vertebral arteries without evidence of significant stenosis allowing for artifact through the proximal right ICA and proximal left vertebral artery. Electronically Signed   By: Sebastian AcheAllen  Grady M.D.   On: 07/14/2020 04:30   MR ANGIO NECK W WO CONTRAST  Result Date: 07/14/2020 CLINICAL DATA:  Right PCA infarct. EXAM: MRA NECK WITHOUT AND WITH CONTRAST MRA HEAD WITHOUT CONTRAST TECHNIQUE: Multiplanar and multiecho pulse sequences of the neck were obtained without and with intravenous contrast. Angiographic images of the neck were obtained using MRA technique without and with intravenous contast.; Angiographic images of the Circle of Willis were obtained using MRA technique without  intravenous contrast. CONTRAST:  10mL GADAVIST GADOBUTROL 1 MMOL/ML IV SOLN COMPARISON:  None. FINDINGS: MRA NECK FINDINGS There is a normal variant 4 vessel aortic arch with the left vertebral artery arising directly from the arch. The brachiocephalic and subclavian arteries are widely patent. The common carotid and cervical internal carotid arteries are patent without evidence of a dissection or definite significant stenosis. Motion artifact mildly limits assessment of the proximal right ICA. The vertebral arteries are patent with antegrade flow bilaterally. There is no evidence of significant stenosis or dissection involving the right vertebral artery which is strongly dominant. Assessment of the proximal left V1 segment is limited by motion artifact and the small size of the vessel. There is no evidence of a significant stenosis or dissection involving the left vertebral artery more distally. A few soft tissue nodules in the right upper neck located inferior to the right parotid tail likely represent lymph nodes and measure up to 9 mm in short axis, nonspecific. MRA HEAD FINDINGS The intracranial vertebral arteries are widely patent to the basilar with the right being strongly dominant. Patent PICAs and SCAs are seen bilaterally. The basilar artery is widely patent. Posterior communicating arteries are not clearly identified and may be diminutive or absent. There is attenuation of distal right PCA branch vessels without evidence of a flow limiting proximal right PCA stenosis. There is a moderate mid left P2 stenosis. The internal carotid arteries are widely patent from skull base to carotid termini. ACAs and MCAs are patent without evidence of a proximal branch occlusion or significant proximal stenosis. No aneurysm is identified. IMPRESSION: 1. Attenuation of distal right PCA branch vessels without evidence of a flow limiting proximal stenosis or large vessel occlusion. 2. Moderate left P2 stenosis. 3. Patent  cervical carotid and vertebral arteries without evidence of significant stenosis allowing for artifact through the proximal right ICA and proximal left vertebral artery. Electronically Signed   By: Sebastian AcheAllen  Grady M.D.   On: 07/14/2020 04:30   MR Brain W and Wo Contrast  Result Date: 07/13/2020 EXAM: MRI HEAD WITHOUT AND WITH CONTRAST TECHNIQUE: Multiplanar, multiecho pulse sequences of the brain and surrounding structures were obtained without and with intravenous contrast. CONTRAST:  10mL GADAVIST GADOBUTROL 1 MMOL/ML IV SOLN COMPARISON:  Same day head CT. FINDINGS: Brain: Acute confluent infarct in the right occipital lobe. There is local mass effect without midline shift. Additional smaller infarcts in the right hippocampus and right aspect of the splenium of the corpus callosum. Possible punctate infarct in the right thalamus (series 5, image 77). Associated acute hemorrhage in the right occipital lobe and parahippocampal region . No hydrocephalus. Remote bilateral cerebellar lacunar infarcts. Mild scattered T2/FLAIR hyperintensities, compatible with chronic microvascular ischemic disease.  No masslike enhancement. Vascular: Major arterial flow voids are maintained at the skull base. Skull and upper cervical spine: Normal marrow signal. Sinuses/Orbits: Negative.  Atretic right maxillary sinus. Other: No mastoid effusions. IMPRESSION: Acute or early subacute right PCA territory infarcts involving the right occipital lobe, right hippocampus, right eccentric splenium of the corpus callosum, and possibly the right thalamus. There is associated acute hemorrhage in the right occipital lobe and parahippocampal region. Local mass effect without midline shift. Finding discussed with Dr. Bernette Mayers At 5:10 PM via telephone. Electronically Signed   By: Feliberto Harts MD   On: 07/13/2020 17:17    PHYSICAL EXAM Pleasant middle-age Caucasian lady not in distress. . Afebrile. Head is nontraumatic. Neck is supple without  bruit.    Cardiac exam no murmur or gallop. Lungs are clear to auscultation. Distal pulses are well felt. Neurological Exam : She is awake alert oriented to time place and person.  Extraocular movements are full range without nystagmus.  No aphasia apraxia or dysarthria.  She has dense left homonymous hemianopsia.  Face is symmetric without weakness.  Tongue midline.  Motor system exam shows symmetric upper and lower extremity strength without focal weakness.  Sensation is intact.  Coordination is accurate.  Gait not tested. ASSESSMENT/PLAN Samantha Klein is a 69 y.o. female with history of atrial fibrillation, hypothyroidism, HLD presenting with L hemianopsia, nausea and vomiting.   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   MRI  R PCA infarct (occipital, hippocampus, eccentric splenium of corpus callosum, thalamus). Hemorrhage in R occipital lobe and parahippocampus. Local mass effect  MRA head & neck small R PCA branch vessels. Moderate L P2 stenosis.   CT head stable R PCA infarct   2D Echo EF 60-65%. No source of embolus. Neg bubble  LDL 106  HgbA1c pending   VTE prophylaxis - SCDs   No antithrombotic prior to admission, now on No antithrombotic given hemorrhagic transformation. Ok to add aspirin 81 mg Wednesday. If CT neg in 1 week, stop aspirin and add Eliquis.  Therapy recommendations:  OP OT, HH or OP PT  Disposition:  Return home  Atrial Fibrillation w/ RVR  Home anticoagulation:  none  . Add aspirin tomorrow . Repeat CT head 1 week after aspirin starts, if hemorrhage resolved stop aspirin and add Eliquis   Hypertension  Stable . Permissive hypertension (OK if < 220/120) but gradually normalize in 5-7 days . Long-term BP goal normotensive  Hyperlipidemia  Home meds:  lipitor 20, resumed in hospital  LDL 106, goal < 70  Hold increase in  statin at this time given hemorrhagic transformation, consider increase to 40 at followup  Continue statin at discharge  Other Stroke Risk Factors  Advanced Age >/= 34   Obesity, Body mass index is 34.58 kg/m., BMI >/= 30 associated with increased stroke risk, recommend weight loss, diet and exercise as appropriate   Other Active Problems  Thyroid disease  Hyperglycemia A1c pending   Hospital day # 1  She has presented with embolic right PCA infarct with hemorrhagic transformation secondary to atrial fibrillation while not being on anticoagulation.  Recommend hold antiplatelet today and start aspirin 81 mg tomorrow and repeat CT scan of the head in 1 week if emerging transformers resolved we will switch to Eliquis at that time.  Continue ongoing stroke work-up and aggressive risk factor modification.  Long  discussion with patient and with Dr. Pola Corn.  The patient was counseled not to drive till the peripheral vision loss improves.  Greater than 50% time during this 35-minute visit was spent on counseling and coordination of care about her embolic stroke and atrial fibrillation and discussion of risk benefit of anticoagulation in the setting of hemorrhagic transformation and answering questions. Delia Heady, MD  To contact Stroke Continuity provider, please refer to [email or web address]. After hours, contact General Neurology

## 2020-07-14 NOTE — Plan of Care (Signed)
  Problem: Education: Goal: Knowledge of disease or condition will improve Outcome: Progressing Goal: Knowledge of secondary prevention will improve Outcome: Progressing Goal: Knowledge of patient specific risk factors addressed and post discharge goals established will improve Outcome: Progressing   Problem: Coping: Goal: Will verbalize positive feelings about self Outcome: Progressing Goal: Will identify appropriate support needs Outcome: Progressing   Problem: Health Behavior/Discharge Planning: Goal: Ability to manage health-related needs will improve Outcome: Progressing   Problem: Self-Care: Goal: Ability to participate in self-care as condition permits will improve Outcome: Progressing Goal: Verbalization of feelings and concerns over difficulty with self-care will improve Outcome: Progressing Goal: Ability to communicate needs accurately will improve Outcome: Progressing   Problem: Nutrition: Goal: Risk of aspiration will decrease Outcome: Progressing Goal: Dietary intake will improve Outcome: Progressing   

## 2020-07-14 NOTE — Evaluation (Signed)
Speech Language Pathology Evaluation Patient Details Name: Samantha Klein MRN: [id number] DOB: [birth year] Today's Date: 07/14/2020 Time: 1010-1030 SLP Time Calculation (min) (ACUTE ONLY): 20 min  Problem List:  Patient Active Problem List   Diagnosis Date Noted  . Acute stroke due to ischemia [facility]) 07/13/2020   Past Medical History:  Past Medical History:  Diagnosis Date  . Atrial fibrillation (HCC)   . Chronic knee pain    left  . Diabetes mellitus (HCC)    diet controlled  . Hypothyroid   . IBS (irritable bowel syndrome)-D   . Thyroid disease    Past Surgical History:  Past Surgical History:  Procedure Laterality Date  . COLPOSCOPY W/ BIOPSY / CURETTAGE    . TUBAL LIGATION     HPI:  Patient is a 69 y/o female who presents with pain behind right eye, nausea and impaired vision. NIH:2 Head CT- 1.7x1.3 cm lobulated abnormality right occipital lobe. 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 PMH includes thyroid disease, A-fib, HLD.   Assessment / Plan / Recommendation Clinical Impression  Patient presents with a mild-moderate cognitive deficit but with expressive and receptive language and speech production intact. Patient appeared restless and was impulsive with responses, answering quickly and fairly quickly saying "I dont know". She reported h/o some forgetfulness but based on her own report, this seemed to be within functional range. Patient received a score of 20 out of possible 30 on [facility] [employer] Mental Status exam) which places her in range of Dementia. 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 This is secondary to negative impact of patient's attention and impulsiveness during testing. She is mainly concerned with being able to be discharged in order to get prepared for upcoming Thanksgiving.   Patient is aware of her deficits with vision and balance, but she did not demonstrate adequate anticipatory awareness to how these deficits would impact her function at home. She did not recall PT session which occured approximately an hour ago and it wasn't until her husband said, "You walked in the hall" that she seemed to start to recall. At this time, patient is exhibiting a cognitive impairment that is negatively impacting her memory, safety awareness, anticipatory awareness and reasoning. She is adamant about going home, and will have supervision from her husband but would likely benefit from [facility] or outpatient SLP to focus on cognitive function.    SLP Assessment  SLP Recommendation/Assessment: All further Speech Lanaguage Pathology  needs can be addressed in the next venue of care SLP Visit Diagnosis: Cognitive communication deficit (R41.841)    Follow Up Recommendations  Outpatient SLP;Home health SLP    Frequency and Duration     N/A      SLP Evaluation Cognition  Overall Cognitive Status: Impaired/Different from baseline Arousal/Alertness: Awake/alert Orientation Level: Oriented X4 Attention: Selective Selective Attention: Impaired Selective Attention Impairment: Verbal complex Memory: Impaired Memory Impairment: Other (comment) (recalled 3/5 words after 3 minute delay) Executive Function: Reasoning Reasoning: Impaired Reasoning Impairment: Functional basic;Functional complex Behaviors: Impulsive;Restless Safety/Judgment: Impaired       Comprehension  Auditory Comprehension Overall Auditory Comprehension: Appears within functional limits for tasks assessed    Expression Expression Primary Mode of Expression: Verbal Verbal Expression Overall Verbal Expression: Appears within functional limits for tasks assessed   Oral / Motor  Oral Motor/Sensory Function Overall Oral Motor/Sensory Function: Within functional  limits Motor Speech Overall Motor Speech: Appears within  functional limits for tasks assessed   GO                   Angela Nevin, MA, CCC-SLP Speech Therapy [facility] Acute Rehab

## 2020-07-14 NOTE — Discharge Summary (Addendum)
Physician Discharge Summary  Samantha RoersMary Ellen Klein WUJ:[id number] DOB: 10-22-1950 DOA: 07/13/2020  PCP: Pcp, No  Admit date: 07/13/2020 Discharge date: 07/14/2020  Admitted From: Home Discharge disposition: Home with outpatient occupational therapy   Code Status: Full Code  Diet Recommendation: Cardiac diet  Discharge Diagnosis:   Active Problems:   Acute stroke due to ischemia [facility](HCC)   Stroke (HCC)  History of Present Illness / Brief narrative:  Samantha RoersMary Ellen Klein is a 69 y.o. female with past medical history of A. fib, hyperlipidemia hypothyroidism. Patient presented to the ED from home today with complaint of severe pain behind the right eye with difficulty and vision impairment.  11/21, on waking up in the morning, patient felt severe pain behind the right eye and had trouble with right eye vision.  She went to see her ophthalmologist.  On evaluation, she was found to have acute loss of vision but no ophthalmic pathology. She was sent to the ED to rule out stroke.  Patient reports nausea and an episode of vomiting while at the ophthalmologist's office.  In the ED, patient was afebrile, heart rate 125, blood pressure elevated to 182/106. Labs with potassium low at 3.3, blood glucose level elevated to 154.  CT scan of head showed 1.7 x 1.3 cm lobulated abnormality in the right occipital lobe with surrounding white matter edema. MRI brain was obtained which showed an acute or early subacute right PCA territory infarcts involving the right occipital lobe, right hippocampus, right eccentric splenium of the corpus callosum, and possibly the right thalamus. There is also an associated acute hemorrhage in the right occipital lobe and parahippocampal region. Local mass effect without midline shift. Neurology was consulted from ED. Hospitalist service consulted for stroke work-up and admission.  At the time of my evaluation, patient was lying down in bed.  Alert, awake, oriented x3.   Continue to have eye symptoms.  No other symptoms.  Husband at bedside.   Heart rate in 90s, irregular, blood pressure in 140s.   Subjective:  Seen and examined this morning.  Pleasant elderly Caucasian female.  Not in physical distress.  Right vision impairment continues. Seen by neurology for follow-up today  Hospital Course:  Acute right PCA territory infarct Post stroke hemorrhage -Presented with right eye pain and vision loss. -Seen by ophthalmologist as an outpatient, ruled out local issue and sent to ED for possible stroke -CT scan of head and MRI brain finding as above showing acute to early subacute right PCA territory infarcts.  MRI also showed post infarct acute hemorrhage in the right occipital lobe. -Neurology consult appreciated. -Stroke work-up done.  Echocardiogram with EF 60 to 65%, no cardiac source of embolism identified. -MR angiogram neck did not show any large vessel occlusion. -TSH normal.  A1c pending, blood sugar close to 150 consistently. -PT/OT eval obtained. Outpatient OT recommended. -Prior to admission, patient was on Lipitor.  Continue the same. -On admission, patient was not started on antiplatelet because of evidence of intracranial hemorrhage.  Discussed with neurology Dr. Pearlean BrownieSethi this morning.  Aspirin 81 mg daily to start from tomorrow. -Patient is to follow-up with neurology in a week for repeat CT scan of head.  If hemorrhage is improving, she will need anticoagulation started for coexisting A. Fib.  Recent Labs  Lab 07/13/20 1239 07/13/20 1352 07/14/20 0416  GLUCOSE 154* 150* 150*  TSH  --   --  2.020   No results found for: HGBA1C     Component Value Date/Time  CHOL 160 07/14/2020 0416   TRIG 131 07/14/2020 0416   HDL 28 (L) 07/14/2020 0416   CHOLHDL 5.7 07/14/2020 0416   VLDL 26 07/14/2020 0416   LDLCALC 106 (H) 07/14/2020 0416   A. fib with RVR -Patient history of A. fib.  Home meds include Cardizem 180 mg daily and Toprol 150 mg at  bedtime. -Continue both. -CHADSVASC score 3-4.  Qualifies for anticoagulation if negative CT in next 1 week.  Accelerated hypertension -Blood pressure elevated up to 180s in the ED.  -Continue metoprolol and Cardizem.   -Continue to monitor blood pressure.  Hypothyroidism -Continue Synthroid.  TSH normal.  Hyperglycemia -Blood sugar running in 150s. Pending A1c  Stable for discharge to home today with OT outpatient Wound care:    Discharge Exam:   Vitals:   07/14/20 0414 07/14/20 0754 07/14/20 1212 07/14/20 1538  BP: 101/62 (!) 163/107 (!) 146/87 (!) 113/54  Pulse:  88 87 75  Resp: 18 16 20 18   Temp: 99.2 F (37.3 C) 98.3 F (36.8 C) 98.6 F (37 C) 98.3 F (36.8 C)  TempSrc: Axillary Oral Oral Oral  SpO2: 94% 90% 94% 93%  Weight:      Height:        Body mass index is 34.58 kg/m.  General exam: Pleasant, not in distress Skin: No rashes, lesions or ulcers. HEENT: Atraumatic, normocephalic, no obvious bleeding Lungs: Clear to auscultation bilaterally CVS: Regular rate and rhythm, no murmur GI/Abd soft, nontender, nondistended, bowel sound present CNS: Alert, awake, oriented x3.  Impaired vision on the left half persistent Psychiatry: Mood appropriate Extremities: No pedal edema, no calf tenderness  Follow ups:   Discharge Instructions    Ambulatory referral to Neurology   Complete by: As directed    Follow up in stroke clinic at [facility] with Ihor Austin, NP in about 4 weeks. If not available, consider Dr. Delia Heady, Dr. Jamelle Rushing, or Dr. Naomie Dean.   Increase activity slowly   Complete by: As directed       Follow-up Information    [facility] Follow up.   Contact information: [address] [place] [address] [phone]       [facility] Follow up.   Specialty: Neurology Why: office will call with appt date and time for stroke clinic  appt Contact information: [address] [place] [address] [phone]             Recommendations for Outpatient Follow-Up:   1. Follow-up with PCP as an outpatient 2. Follow-up with neurologist as an outpatient  Discharge Instructions:  Follow with Primary MD Pcp, No in 7 days   Get CBC/BMP checked in next visit within 1 week by PCP or SNF MD ( we routinely change or add medications that can affect your baseline labs and fluid status, therefore we recommend that you get the mentioned basic workup next visit with your PCP, your PCP may decide not to get them or add new tests based on their clinical decision)  On your next visit with your PCP, please Get Medicines reviewed and adjusted.  Please request your PCP  to go over all Hospital Tests and Procedure/Radiological results at the follow up, please get all Hospital records sent to your Prim MD by signing hospital release before you go home.  Activity: As tolerated with Full fall precautions use walker/cane & assistance as needed  For Heart failure patients - Check  your Weight same time everyday, if you gain over 2 pounds, or you develop in leg swelling, experience more shortness of breath or chest pain, call your Primary MD immediately. Follow Cardiac Low Salt Diet and 1.5 lit/day fluid restriction.  If you have smoked or chewed Tobacco in the last 2 yrs please stop smoking, stop any regular Alcohol  and or any Recreational drug use.  If you experience worsening of your admission symptoms, develop shortness of breath, life threatening emergency, suicidal or homicidal thoughts you must seek medical attention immediately by calling 911 or calling your MD immediately  if symptoms less severe.  You Must read complete instructions/literature along with all the possible adverse reactions/side effects for all the Medicines you take and that have been prescribed to you. Take any new Medicines after you have  completely understood and accpet all the possible adverse reactions/side effects.   Do not drive, operate heavy machinery, perform activities at heights, swimming or participation in water activities or provide baby sitting services if your were admitted for syncope or siezures until you have seen by Primary MD or a Neurologist and advised to do so again.  Do not drive when taking Pain medications.  Do not take more than prescribed Pain, Sleep and Anxiety Medications  Wear Seat belts while driving.   Please note You were cared for by a hospitalist during your hospital stay. If you have any questions about your discharge medications or the care you received while you were in the hospital after you are discharged, you can call the unit and asked to speak with the hospitalist on call if the hospitalist that took care of you is not available. Once you are discharged, your primary care physician will handle any further medical issues. Please note that NO REFILLS for any discharge medications will be authorized once you are discharged, as it is imperative that you return to your primary care physician (or establish a relationship with a primary care physician if you do not have one) for your aftercare needs so that they can reassess your need for medications and monitor your lab values.    Allergies as of 07/14/2020      Reactions   Tape Other (See Comments)   Adhesive tape causes redness   Sulfa Antibiotics Rash      Medication List    STOP taking these medications   acetaminophen 500 MG tablet Commonly known as: TYLENOL   chlorhexidine 0.12 % solution Commonly known as: PERIDEX     TAKE these medications   aspirin EC 81 MG tablet Take 1 tablet (81 mg total) by mouth daily. Swallow whole. Start taking on: July 15, 2020   atorvastatin 20 MG tablet Commonly known as: LIPITOR Take 20 mg by mouth at bedtime.   diclofenac 75 MG EC tablet Commonly known as: VOLTAREN Take 75 mg by  mouth at bedtime.   dicyclomine 20 MG tablet Commonly known as: BENTYL Take 20 mg by mouth See admin instructions. Take one tablet (20 mg) by mouth four times daily - before meals and at bedtime   diltiazem 180 MG 24 hr capsule Commonly known as: CARDIZEM CD Take 180 mg by mouth daily.   levothyroxine 50 MCG tablet Commonly known as: SYNTHROID Take 50 mcg by mouth daily before breakfast.   metoprolol succinate 100 MG 24 hr tablet Commonly known as: TOPROL-XL Take 150 mg by mouth at bedtime.   traMADol 50 MG tablet Commonly known as: ULTRAM Take 1 tablet (50 mg  total) by mouth every 6 (six) hours as needed for up to 5 days for moderate pain.       Time coordinating discharge: 35 minutes  The results of significant diagnostics from this hospitalization (including imaging, microbiology, ancillary and laboratory) are listed below for reference.    Procedures and Diagnostic Studies:   CT HEAD WO CONTRAST  Result Date: 07/14/2020 CLINICAL DATA:  Headache.  Follow-up hemorrhagic right PCA infarct. EXAM: CT HEAD WITHOUT CONTRAST TECHNIQUE: Contiguous axial images were obtained from the base of the skull through the vertex without intravenous contrast. COMPARISON:  Head CT and MRI 07/13/2020 FINDINGS: Brain: A large right PCA infarct is again seen with unchanged hemorrhage including a 3.0 x 1.3 cm region of hemorrhage in the right occipital lobe. Cytotoxic edema and local mass effect are unchanged. There is no midline shift. No new infarct, new intracranial hemorrhage, or extra-axial fluid collection is identified. A small chronic right cerebellar infarct is again noted. The ventricles are normal in size aside from partial effacement of the right lateral ventricle in the region of the infarct. Vascular: Calcified atherosclerosis at the skull base. Skull: No fracture or suspicious osseous lesion. Sinuses/Orbits: Small right maxillary sinus. Clear mastoid air cells. Unremarkable orbits. Other:  None. IMPRESSION: 1. Right PCA infarct with unchanged hemorrhage. 2. No evidence of new intracranial abnormality. Electronically Signed   By: Sebastian Ache M.D.   On: 07/14/2020 06:36   CT HEAD WO CONTRAST  Result Date: 07/13/2020 CLINICAL DATA:  Right-sided vision loss. EXAM: CT HEAD WITHOUT CONTRAST TECHNIQUE: Contiguous axial images were obtained from the base of the skull through the vertex without intravenous contrast. COMPARISON:  None. FINDINGS: Brain: 1.7 x 1.3 cm lobulated abnormality is seen in the right occipital lobe with surrounding white matter edema most consistent with neoplasm or malignancy. Acute infarction cannot be excluded. MRI is recommended for further evaluation. Mild chronic ischemic white matter disease is noted. Ventricular size is within normal limits. No midline shift is noted. No definite hemorrhage is noted. Vascular: No hyperdense vessel or unexpected calcification. Skull: Normal. Negative for fracture or focal lesion. Sinuses/Orbits: No acute finding. Other: None. IMPRESSION: 1.7 x 1.3 cm lobulated abnormality is seen in the right occipital lobe with surrounding white matter edema most consistent with neoplasm or malignancy. Acute infarction cannot be excluded. MRI with and without gadolinium is recommended for further evaluation. Electronically Signed   By: Lupita Raider M.D.   On: 07/13/2020 13:54   MR ANGIO HEAD WO CONTRAST  Result Date: 07/14/2020 CLINICAL DATA:  Right PCA infarct. EXAM: MRA NECK WITHOUT AND WITH CONTRAST MRA HEAD WITHOUT CONTRAST TECHNIQUE: Multiplanar and multiecho pulse sequences of the neck were obtained without and with intravenous contrast. Angiographic images of the neck were obtained using MRA technique without and with intravenous contast.; Angiographic images of the Circle of Willis were obtained using MRA technique without intravenous contrast. CONTRAST:  71mL GADAVIST GADOBUTROL 1 MMOL/ML IV SOLN COMPARISON:  None. FINDINGS: MRA NECK  FINDINGS There is a normal variant 4 vessel aortic arch with the left vertebral artery arising directly from the arch. The brachiocephalic and subclavian arteries are widely patent. The common carotid and cervical internal carotid arteries are patent without evidence of a dissection or definite significant stenosis. Motion artifact mildly limits assessment of the proximal right ICA. The vertebral arteries are patent with antegrade flow bilaterally. There is no evidence of significant stenosis or dissection involving the right vertebral artery which is strongly dominant. Assessment of the  proximal left V1 segment is limited by motion artifact and the small size of the vessel. There is no evidence of a significant stenosis or dissection involving the left vertebral artery more distally. A few soft tissue nodules in the right upper neck located inferior to the right parotid tail likely represent lymph nodes and measure up to 9 mm in short axis, nonspecific. MRA HEAD FINDINGS The intracranial vertebral arteries are widely patent to the basilar with the right being strongly dominant. Patent PICAs and SCAs are seen bilaterally. The basilar artery is widely patent. Posterior communicating arteries are not clearly identified and may be diminutive or absent. There is attenuation of distal right PCA branch vessels without evidence of a flow limiting proximal right PCA stenosis. There is a moderate mid left P2 stenosis. The internal carotid arteries are widely patent from skull base to carotid termini. ACAs and MCAs are patent without evidence of a proximal branch occlusion or significant proximal stenosis. No aneurysm is identified. IMPRESSION: 1. Attenuation of distal right PCA branch vessels without evidence of a flow limiting proximal stenosis or large vessel occlusion. 2. Moderate left P2 stenosis. 3. Patent cervical carotid and vertebral arteries without evidence of significant stenosis allowing for artifact through the  proximal right ICA and proximal left vertebral artery. Electronically Signed   By: Sebastian Ache M.D.   On: 07/14/2020 04:30   MR ANGIO NECK W WO CONTRAST  Result Date: 07/14/2020 CLINICAL DATA:  Right PCA infarct. EXAM: MRA NECK WITHOUT AND WITH CONTRAST MRA HEAD WITHOUT CONTRAST TECHNIQUE: Multiplanar and multiecho pulse sequences of the neck were obtained without and with intravenous contrast. Angiographic images of the neck were obtained using MRA technique without and with intravenous contast.; Angiographic images of the Circle of Willis were obtained using MRA technique without intravenous contrast. CONTRAST:  10mL GADAVIST GADOBUTROL 1 MMOL/ML IV SOLN COMPARISON:  None. FINDINGS: MRA NECK FINDINGS There is a normal variant 4 vessel aortic arch with the left vertebral artery arising directly from the arch. The brachiocephalic and subclavian arteries are widely patent. The common carotid and cervical internal carotid arteries are patent without evidence of a dissection or definite significant stenosis. Motion artifact mildly limits assessment of the proximal right ICA. The vertebral arteries are patent with antegrade flow bilaterally. There is no evidence of significant stenosis or dissection involving the right vertebral artery which is strongly dominant. Assessment of the proximal left V1 segment is limited by motion artifact and the small size of the vessel. There is no evidence of a significant stenosis or dissection involving the left vertebral artery more distally. A few soft tissue nodules in the right upper neck located inferior to the right parotid tail likely represent lymph nodes and measure up to 9 mm in short axis, nonspecific. MRA HEAD FINDINGS The intracranial vertebral arteries are widely patent to the basilar with the right being strongly dominant. Patent PICAs and SCAs are seen bilaterally. The basilar artery is widely patent. Posterior communicating arteries are not clearly identified and  may be diminutive or absent. There is attenuation of distal right PCA branch vessels without evidence of a flow limiting proximal right PCA stenosis. There is a moderate mid left P2 stenosis. The internal carotid arteries are widely patent from skull base to carotid termini. ACAs and MCAs are patent without evidence of a proximal branch occlusion or significant proximal stenosis. No aneurysm is identified. IMPRESSION: 1. Attenuation of distal right PCA branch vessels without evidence of a flow limiting proximal stenosis or  large vessel occlusion. 2. Moderate left P2 stenosis. 3. Patent cervical carotid and vertebral arteries without evidence of significant stenosis allowing for artifact through the proximal right ICA and proximal left vertebral artery. Electronically Signed   By: Sebastian Ache M.D.   On: 07/14/2020 04:30   MR Brain W and Wo Contrast  Result Date: 07/13/2020 EXAM: MRI HEAD WITHOUT AND WITH CONTRAST TECHNIQUE: Multiplanar, multiecho pulse sequences of the brain and surrounding structures were obtained without and with intravenous contrast. CONTRAST:  10mL GADAVIST GADOBUTROL 1 MMOL/ML IV SOLN COMPARISON:  Same day head CT. FINDINGS: Brain: Acute confluent infarct in the right occipital lobe. There is local mass effect without midline shift. Additional smaller infarcts in the right hippocampus and right aspect of the splenium of the corpus callosum. Possible punctate infarct in the right thalamus (series 5, image 77). Associated acute hemorrhage in the right occipital lobe and parahippocampal region . No hydrocephalus. Remote bilateral cerebellar lacunar infarcts. Mild scattered T2/FLAIR hyperintensities, compatible with chronic microvascular ischemic disease. No masslike enhancement. Vascular: Major arterial flow voids are maintained at the skull base. Skull and upper cervical spine: Normal marrow signal. Sinuses/Orbits: Negative.  Atretic right maxillary sinus. Other: No mastoid effusions.  IMPRESSION: Acute or early subacute right PCA territory infarcts involving the right occipital lobe, right hippocampus, right eccentric splenium of the corpus callosum, and possibly the right thalamus. There is associated acute hemorrhage in the right occipital lobe and parahippocampal region. Local mass effect without midline shift. Finding discussed with Dr. Bernette Mayers At 5:10 PM via telephone. Electronically Signed   By: Feliberto Harts MD   On: 07/13/2020 17:17   ECHOCARDIOGRAM COMPLETE BUBBLE STUDY  Result Date: 07/14/2020    ECHOCARDIOGRAM REPORT   Patient Name:   DASHANA GUIZAR [facility] Date of Exam: 07/14/2020 Medical Rec #:  [id number]         Height:       67.0 in Accession #:    [id number]        Weight:       220.8 lb Date of Birth:  Nov 13, 1950        BSA:          2.109 m Patient Age:    69 years          BP:           163/107 mmHg Patient Gender: F                 HR:           88 bpm. Exam Location:  Inpatient Procedure: 2D Echo, Intracardiac Opacification Agent, Saline Contrast Bubble            Study, Color Doppler and Cardiac Doppler Indications:    Stroke 434.91 / I63.9  History:        Patient has no prior history of Echocardiogram examinations.                 Arrythmias:Atrial Fibrillation. Thyroid disease.  Sonographer:    Leta Jungling RDCS Referring Phys: [id number] [facility]  Sonographer Comments: Suboptimal apical window. IMPRESSIONS  1. Left ventricular ejection fraction, by estimation, is 60 to 65%. The left ventricle has normal function. The left ventricle has no regional wall motion abnormalities. Left ventricular diastolic function could not be evaluated.  2. Right ventricular systolic function is normal. The right ventricular size is normal.  3. The mitral valve is normal in structure. Trivial mitral valve regurgitation. No evidence of mitral stenosis.  4.  The aortic valve is tricuspid. Aortic valve regurgitation is mild. No aortic stenosis is present.  5. The inferior vena cava is  normal in size with greater than 50% respiratory variability, suggesting right atrial pressure of 3 mmHg.  6. Agitated saline contrast bubble study was negative, with no evidence of any interatrial shunt. FINDINGS  Left Ventricle: Left ventricular ejection fraction, by estimation, is 60 to 65%. The left ventricle has normal function. The left ventricle has no regional wall motion abnormalities. Definity contrast agent was given IV to delineate the left ventricular  endocardial borders. The left ventricular internal cavity size was normal in size. There is no left ventricular hypertrophy. Left ventricular diastolic function could not be evaluated due to atrial fibrillation. Left ventricular diastolic function could  not be evaluated. Right Ventricle: The right ventricular size is normal. No increase in right ventricular wall thickness. Right ventricular systolic function is normal. Left Atrium: Left atrial size was normal in size. Right Atrium: Right atrial size was normal in size. Pericardium: There is no evidence of pericardial effusion. Mitral Valve: The mitral valve is normal in structure. Trivial mitral valve regurgitation. No evidence of mitral valve stenosis. Tricuspid Valve: The tricuspid valve is normal in structure. Tricuspid valve regurgitation is mild . No evidence of tricuspid stenosis. Aortic Valve: The aortic valve is tricuspid. Aortic valve regurgitation is mild. No aortic stenosis is present. Pulmonic Valve: The pulmonic valve was grossly normal. Pulmonic valve regurgitation is mild. No evidence of pulmonic stenosis. Aorta: The aortic root is normal in size and structure. Venous: The inferior vena cava is normal in size with greater than 50% respiratory variability, suggesting right atrial pressure of 3 mmHg. IAS/Shunts: The interatrial septum was not well visualized. Agitated saline contrast was given intravenously to evaluate for intracardiac shunting. Agitated saline contrast bubble study was  negative, with no evidence of any interatrial shunt.  LEFT VENTRICLE PLAX 2D LVIDd:         4.41 cm      Diastology LVIDs:         3.12 cm      LV e' medial:    6.82 cm/s LV PW:         0.95 cm      LV E/e' medial:  16.6 LV IVS:        1.36 cm      LV e' lateral:   6.05 cm/s LVOT diam:     1.90 cm      LV E/e' lateral: 18.7 LV SV:         41 LV SV Index:   19 LVOT Area:     2.84 cm  LV Volumes (MOD) LV vol d, MOD A2C: 71.3 ml LV vol d, MOD A4C: 103.0 ml LV vol s, MOD A2C: 38.7 ml LV vol s, MOD A4C: 30.7 ml LV SV MOD A2C:     32.6 ml LV SV MOD A4C:     103.0 ml LV SV MOD BP:      53.6 ml LEFT ATRIUM             Index LA diam:        3.90 cm 1.85 cm/m LA Vol (A2C):   62.4 ml 29.59 ml/m LA Vol (A4C):   78.7 ml 37.31 ml/m LA Biplane Vol: 70.6 ml 33.47 ml/m  AORTIC VALVE LVOT Vmax:   84.20 cm/s LVOT Vmean:  57.400 cm/s LVOT VTI:    0.145 m  AORTA Ao Root diam: 2.90 cm Ao  Asc diam:  3.30 cm MITRAL VALVE MV Area (PHT): 5.62 cm     SHUNTS MV Decel Time: 135 msec     Systemic VTI:  0.14 m MV E velocity: 113.00 cm/s  Systemic Diam: 1.90 cm Mihai Croitoru MD Electronically signed by Thurmon Fair MD Signature Date/Time: 07/14/2020/12:43:33 PM    Final      Labs:   Basic Metabolic Panel: Recent Labs  Lab 07/13/20 1239 07/13/20 1239 07/13/20 1352 07/14/20 0416  NA 138  --  139 136  K 3.3*   < > 3.2* 3.4*  CL 93*  --  94* 95*  CO2 28  --   --  29  GLUCOSE 154*  --  150* 150*  BUN 8  --  8 11  CREATININE 0.98  --  0.90 1.09*  CALCIUM 9.8  --   --  9.5   < > = values in this interval not displayed.   GFR Estimated Creatinine Clearance: 59.2 mL/min (A) (by C-G formula based on SCr of 1.09 mg/dL (H)). Liver Function Tests: Recent Labs  Lab 07/13/20 1239  AST 31  ALT 23  ALKPHOS 65  BILITOT 1.3*  PROT 8.9*  ALBUMIN 4.3   No results for input(s): LIPASE, AMYLASE in the last 168 hours. No results for input(s): AMMONIA in the last 168 hours. Coagulation profile Recent Labs  Lab  07/13/20 1239  INR 1.2    CBC: Recent Labs  Lab 07/13/20 1239 07/13/20 1352 07/14/20 0416  WBC 10.1  --  11.2*  NEUTROABS 8.3*  --   --   HGB 15.6* 16.3* 14.0  HCT 46.5* 48.0* 42.2  MCV 90.8  --  90.2  PLT 255  --  225   Cardiac Enzymes: No results for input(s): CKTOTAL, CKMB, CKMBINDEX, TROPONINI in the last 168 hours. BNP: Invalid input(s): POCBNP CBG: Recent Labs  Lab 07/13/20 2245  GLUCAP 131*   D-Dimer No results for input(s): DDIMER in the last 72 hours. Hgb A1c No results for input(s): HGBA1C in the last 72 hours. Lipid Profile Recent Labs    07/14/20 0416  CHOL 160  HDL 28*  LDLCALC 106*  TRIG 131  CHOLHDL 5.7   Thyroid function studies Recent Labs    07/14/20 0416  TSH 2.020   Anemia work up No results for input(s): VITAMINB12, FOLATE, FERRITIN, TIBC, IRON, RETICCTPCT in the last 72 hours. Microbiology Recent Results (from the past 240 hour(s))  Respiratory Panel by RT PCR (Flu A&B, Covid) - Nasopharyngeal Swab     Status: None   Collection Time: 07/13/20  9:10 PM   Specimen: Nasopharyngeal Swab; Nasopharyngeal(NP) swabs in vial transport medium  Result Value Ref Range Status   SARS Coronavirus 2 by RT PCR NEGATIVE NEGATIVE Final    Comment: (NOTE) SARS-CoV-2 target nucleic acids are NOT DETECTED.  The SARS-CoV-2 RNA is generally detectable in upper respiratoy specimens during the acute phase of infection. The lowest concentration of SARS-CoV-2 viral copies this assay can detect is 131 copies/mL. A negative result does not preclude SARS-Cov-2 infection and should not be used as the sole basis for treatment or other patient management decisions. A negative result may occur with  improper specimen collection/handling, submission of specimen other than nasopharyngeal swab, presence of viral mutation(s) within the areas targeted by this assay, and inadequate number of viral copies (<131 copies/mL). A negative result must be combined with  clinical observations, patient history, and epidemiological information. The expected result is Negative.  Fact Sheet for  Patients:  https://www.moore.com/  Fact Sheet for Healthcare Providers:  https://www.young.biz/  This test is no t yet approved or cleared by the [place] FDA and  has been authorized for detection and/or diagnosis of SARS-CoV-2 by FDA under an Emergency Use Authorization (EUA). This EUA will remain  in effect (meaning this test can be used) for the duration of the COVID-19 declaration under Section 564(b)(1) of the Act, 21 U.S.C. section 360bbb-3(b)(1), unless the authorization is terminated or revoked sooner.     Influenza A by PCR NEGATIVE NEGATIVE Final   Influenza B by PCR NEGATIVE NEGATIVE Final    Comment: (NOTE) The Xpert Xpress SARS-CoV-2/FLU/RSV assay is intended as an aid in  the diagnosis of influenza from Nasopharyngeal swab specimens and  should not be used as a sole basis for treatment. Nasal washings and  aspirates are unacceptable for Xpert Xpress SARS-CoV-2/FLU/RSV  testing.  Fact Sheet for Patients: https://www.moore.com/  Fact Sheet for Healthcare Providers: https://www.young.biz/  This test is not yet approved or cleared by the [place] FDA and  has been authorized for detection and/or diagnosis of SARS-CoV-2 by  FDA under an Emergency Use Authorization (EUA). This EUA will remain  in effect (meaning this test can be used) for the duration of the  Covid-19 declaration under Section 564(b)(1) of the Act, 21  U.S.C. section 360bbb-3(b)(1), unless the authorization is  terminated or revoked. Performed at [facility], [address] [place], [place] [address]      Signed: Lorin Glass  [facility] 07/14/2020, 4:29 PM

## 2020-07-14 NOTE — Progress Notes (Signed)
SLP Cancellation Note  Patient Details Name: Samantha Klein MRN: [id number] DOB: 1951/07/03   Cancelled treatment:       Reason Eval/Treat Not Completed: Other (comment) (SLP screened for swallow but no reports of swallow difficulties, passed YALE. Please consult with SLP if need arises. Thank you for this consult.)   Angela Nevin, MA, CCC-SLP Speech Therapy

## 2020-07-14 NOTE — Evaluation (Signed)
Physical Therapy Evaluation Patient Details Name: Samantha Klein MRN: [id number] DOB: Apr 23, 1951 Today's Date: 07/14/2020   History of Present Illness  Patient is a 69 y/o female who presents with pain behind right eye, nausea and impaired vision. NIH:2 Head CT- 1.7x1.3 cm lobulated abnormality right occipital lobe. 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 PMH includes thyroid disease, A-fib, HLD.  Clinical Impression  Patient presents with visual deficits, nausea, impaired balance, light sensitivity and impaired mobility s/p above. Pt lives at home with her spouse and is independent for ADLs/IADLs PTA. Today, pt requires Min A for transfers and gait training due to unsteadiness, nausea, and having difficulty managing left side of visual field during mobility. Pt adamant about returning home to cook Thanksgiving dinner despite deficits. Pt with poor awareness of deficits/safety and overall condition impacting functional mobility. Would benefit from CIR to maximize independence and mobility prior to return home. Pt refusing all therapy services at this time however.     Follow Up Recommendations CIR;Supervision for mobility/OOB (however pt refusing all therapies and wants to go home)    Equipment Recommendations  None recommended by PT    Recommendations for Other Services       Precautions / Restrictions Precautions Precautions: Fall Precaution Comments: left hemaniopsia Restrictions Weight Bearing Restrictions: No      Mobility  Bed Mobility Overal bed mobility: Modified Independent             General bed mobility comments: No assist needed. No dizziness.    Transfers Overall transfer level: Needs assistance Equipment used: None Transfers: Sit to/from Stand Sit to Stand: Min assist         General transfer comment: Min A to steady in standing as pt with LOB when turning to walk. Cues to slow  down, for gaze stabilization and to be aware of left side.  Ambulation/Gait Ambulation/Gait assistance: Min assist Gait Distance (Feet): 60 Feet Assistive device: 1 person hand held assist Gait Pattern/deviations: Step-through pattern;Decreased stride length;Wide base of support;Shuffle Gait velocity: decreased   General Gait Details: Slow, guarded gait with pt reaching out with LUE to prevent from bumping into things. Cues to be aware of left environment. Cues for turns (eyes/head/body). Very unsteady. Reports feeling "swimmy headed" and nausea.  Stairs            Wheelchair Mobility    Modified Rankin (Stroke Patients Only) Modified Rankin (Stroke Patients Only) Pre-Morbid Rankin Score: No symptoms Modified Rankin: Moderately severe disability     Balance Overall balance assessment: Needs assistance Sitting-balance support: Feet supported;No upper extremity supported Sitting balance-Leahy Scale: Good     Standing balance support: During functional activity Standing balance-Leahy Scale: Fair Standing balance comment: Able to stand statically without Ue support, needs close min guard-Min A for dynamic tasks due to multiple LOB.                             Pertinent Vitals/Pain Pain Assessment: No/denies pain    Home Living Family/patient expects to be discharged to:: Private residence Living Arrangements: Spouse/significant other Available Help at Discharge: Family;Available 24 hours/day Type of Home: House Home Access: Stairs to enter Entrance Stairs-Rails: Right (at the front) Entrance Stairs-Number of Steps: 2 little steps Home Layout: One level Home Equipment: Shower seat - built in;Walker - 2 wheels;Cane - single point      Prior Function Level of Independence: Independent  Comments: Doing ADls/IADLs, driving. No falls in last 6 months.     Hand Dominance   Dominant Hand: Right    Extremity/Trunk Assessment   Upper Extremity  Assessment Upper Extremity Assessment: Defer to OT evaluation    Lower Extremity Assessment Lower Extremity Assessment: Overall WFL for tasks assessed    Cervical / Trunk Assessment Cervical / Trunk Assessment: Normal  Communication   Communication: No difficulties  Cognition Arousal/Alertness: Awake/alert Behavior During Therapy: WFL for tasks assessed/performed Overall Cognitive Status: Impaired/Different from baseline Area of Impairment: Safety/judgement;Awareness                         Safety/Judgement: Decreased awareness of safety;Decreased awareness of deficits Awareness: Intellectual   General Comments: "I will be going home and cooking Thanksgiving dinner" Poor awarenes of deficits/overall condition impacting mobility/safety. Masks with humor at times.      General Comments General comments (skin integrity, edema, etc.): Spouse present during session. Discussion wtih pt about disposition and recommendation of CIR however pt reports she will be going home and does not need PT services and will be cooking thanksgiving dinner.    Exercises     Assessment/Plan    PT Assessment Patient needs continued PT services  PT Problem List Decreased mobility;Decreased safety awareness;Decreased activity tolerance;Decreased balance;Decreased knowledge of use of DME;Decreased cognition       PT Treatment Interventions Therapeutic activities;DME instruction;Gait training;Therapeutic exercise;Patient/family education;Balance training;Stair training;Functional mobility training;Neuromuscular re-education    PT Goals (Current goals can be found in the Care Plan section)  Acute Rehab PT Goals Patient Stated Goal: to go home and cook thanksgiving dinner PT Goal Formulation: With patient Time For Goal Achievement: 07/28/20 Potential to Achieve Goals: Fair    Frequency Min 4X/week   Barriers to discharge Inaccessible home environment steps to enter home     Co-evaluation               AM-PAC PT "6 Clicks" Mobility  Outcome Measure Help needed turning from your back to your side while in a flat bed without using bedrails?: None Help needed moving from lying on your back to sitting on the side of a flat bed without using bedrails?: None Help needed moving to and from a bed to a chair (including a wheelchair)?: A Little Help needed standing up from a chair using your arms (e.g., wheelchair or bedside chair)?: A Little Help needed to walk in hospital room?: A Little Help needed climbing 3-5 steps with a railing? : A Lot 6 Click Score: 19    End of Session Equipment Utilized During Treatment: Gait belt Activity Tolerance: Other (comment) (limited due to swimmy headedness/nausea) Patient left: in chair;with call bell/phone within reach;with chair alarm set;with family/visitor present Nurse Communication: Mobility status PT Visit Diagnosis: Unsteadiness on feet (R26.81);Difficulty in walking, not elsewhere classified (R26.2);Dizziness and giddiness (R42)    Time: 4356-8616 PT Time Calculation (min) (ACUTE ONLY): 30 min   Charges:   PT Evaluation $PT Eval Moderate Complexity: 1 Mod PT Treatments $Gait Training: 8-22 mins        Vale Haven, PT, DPT Acute Rehabilitation Services Pager [phone] Office [phone]      Blake Divine A Lanier Ensign 07/14/2020, 9:21 AM

## 2020-07-14 NOTE — Evaluation (Signed)
Occupational Therapy Evaluation Patient Details Name: Samantha Klein MRN: [id number] DOB: [birth year] Today's Date: 07/14/2020    History of Present Illness Patient is a 69 y/o female who presents with pain behind right eye, nausea and impaired vision. NIH:2 Head CT- 1.7x1.3 cm lobulated abnormality right occipital lobe. 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 PMH includes thyroid disease, A-fib, HLD.   Clinical Impression   PTA, pt was living at home with her husband, pt reports she was independent with ADL/IADL and functional mobility. Pt currently requires minguard assistance during functional mobility. She demonstrates visual limitations impacting her ability to safely engage in her environment. Pt required moderate cues for compensatory strategies for visual limitations. Educated pt and husband on compensatory strategies for safe engagement in the home. Pt adamantly declined d/c to CIR and stated she "does not like to depend on anyone". Educated pt and husband on fall risk prevention strategies for safe engagement in the home. Due to decline in current level of function, pt would benefit from acute OT to address established goals to facilitate safe D/C to venue listed below. At this time, recommend outpatient OT follow-up. Will continue to follow acutely.     Follow Up Recommendations  Outpatient OT;Supervision/Assistance - 24 hour    Equipment Recommendations  None recommended by OT    Recommendations for Other Services       Precautions / Restrictions Precautions Precautions: Fall Precaution Comments: left hemaniopsia Restrictions Weight Bearing Restrictions: No      Mobility Bed Mobility Overal bed mobility: Modified Independent             General bed mobility comments: No assist needed. No dizziness.    Transfers Overall transfer level: Needs assistance Equipment used: None Transfers: Sit  to/from Stand Sit to Stand: Min guard         General transfer comment: minguard to steady, cues for safety with mobility cues for gaze stabilization and scanning environment prior to ambulating    Balance Overall balance assessment: Needs assistance Sitting-balance support: Feet supported;No upper extremity supported Sitting balance-Leahy Scale: Good     Standing balance support: During functional activity Standing balance-Leahy Scale: Fair Standing balance comment: Able to stand statically without Ue support, needs close min guard-Min A for dynamic tasks due to multiple LOB.                           ADL either performed or assessed with clinical judgement   ADL Overall ADL's : Needs assistance/impaired Eating/Feeding: Set up;Sitting   Grooming: Min guard;Standing   Upper Body Bathing: Set up;Sitting   Lower Body Bathing: Min guard;Sit to/from stand   Upper Body Dressing : Set up;Sitting   Lower Body Dressing: Min guard;Sit to/from stand   Toilet Transfer: Min guard;Ambulation Toilet Transfer Details (indicate cue type and reason): in room mobility, minguard for safety, cues for scanning environment Toileting- Clothing Manipulation and Hygiene: Min guard;Sit to/from stand       Functional mobility during ADLs: Min guard General ADL Comments: pt with decreased use of visual compensatory strategies, required cues for safety awareness and mingaurd for stability, pt with instability during in room mobilization     Vision Baseline Vision/History: Wears glasses Wears Glasses: At all times Patient Visual Report: Peripheral vision impairment Vision Assessment?: Yes Eye Alignment: Impaired (comment) (right gaze preference) Ocular Range of Motion: Within Functional Limits Alignment/Gaze Preference: Head turned;Head tilt;Gaze right Tracking/Visual  Pursuits: Requires cues, head turns, or add eye shifts to track;Unable to hold eye position out of midline;Decreased  smoothness of eye movement to LEFT inferior field;Decreased smoothness of eye movement to LEFT superior field Saccades: Undershoots;Additional eye shifts occurred during testing;Additional head turns occurred during testing;Other (comment) (pt only able to see finger in right visual field) Convergence: Within functional limits Visual Fields: Left visual field deficit Depth Perception: Undershoots Additional Comments: pt undershooting with depth perception, improvement with perception with left eye/right eye closed. Pt with L hemianopsia, able to locate items once they've reached midline     Perception Perception Perception Tested?: Yes Spatial deficits: no deficits noted   Praxis      Pertinent Vitals/Pain Pain Assessment: No/denies pain     Hand Dominance Right   Extremity/Trunk Assessment Upper Extremity Assessment Upper Extremity Assessment: Overall WFL for tasks assessed   Lower Extremity Assessment Lower Extremity Assessment: Overall WFL for tasks assessed   Cervical / Trunk Assessment Cervical / Trunk Assessment: Normal   Communication Communication Communication: No difficulties   Cognition Arousal/Alertness: Awake/alert Behavior During Therapy: WFL for tasks assessed/performed Overall Cognitive Status: Impaired/Different from baseline Area of Impairment: Safety/judgement;Awareness                         Safety/Judgement: Decreased awareness of safety;Decreased awareness of deficits Awareness: Intellectual   General Comments: pt requiring cues for safety with functional mobility, pt appears to have decreased awareness of significance of visual limitation   General Comments  spouse present during session.     Exercises     Shoulder Instructions      Home Living Family/patient expects to be discharged to:: Private residence Living Arrangements: Spouse/significant other Available Help at Discharge: Family;Available 24 hours/day Type of Home:  House Home Access: Stairs to enter [employer] of Steps: 2 little steps Entrance Stairs-Rails: Right (at the front) Home Layout: One level     Bathroom Shower/Tub: Producer, television/film/video: Standard     Home Equipment: Shower seat - built in;Walker - 2 wheels;Cane - single point          Prior Functioning/Environment Level of Independence: Independent        Comments: Doing ADls/IADLs, driving. No falls in last 6 months.        OT Problem List: Decreased activity tolerance;Impaired balance (sitting and/or standing);Impaired vision/perception;Decreased safety awareness;Decreased knowledge of use of DME or AE      OT Treatment/Interventions: Self-care/ADL training;Energy conservation;DME and/or AE instruction;Therapeutic activities;Patient/family education;Visual/perceptual remediation/compensation;Balance training    OT Goals(Current goals can be found in the care plan section) Acute Rehab OT Goals Patient Stated Goal: to go home and cook thanksgiving dinner OT Goal Formulation: With patient Time For Goal Achievement: 07/28/20 Potential to Achieve Goals: Good ADL Goals Pt Will Perform Grooming: with modified independence;standing Pt Will Transfer to Toilet: with modified independence;ambulating Additional ADL Goal #1: Pt will demonstrate independence with compensatory strategies for visual limitations for safe engagement in ADL/IADL and functional mobility.  OT Frequency: Min 2X/week   Barriers to D/C:            Co-evaluation              AM-PAC OT "6 Clicks" Daily Activity     Outcome Measure Help from another person eating meals?: A Little Help from another person taking care of personal grooming?: A Little Help from another person toileting, which includes using toliet, bedpan, or urinal?: A Little Help  from another person bathing (including washing, rinsing, drying)?: A Little Help from another person to put on and taking off regular  upper body clothing?: A Little Help from another person to put on and taking off regular lower body clothing?: A Little 6 Click Score: 18   End of Session Equipment Utilized During Treatment: Gait belt Nurse Communication: Mobility status  Activity Tolerance: Patient tolerated treatment well Patient left: in bed;with call bell/phone within reach;with bed alarm set  OT Visit Diagnosis: Unsteadiness on feet (R26.81);Other abnormalities of gait and mobility (R26.89);Muscle weakness (generalized) (M62.81);Low vision, both eyes (H54.2)                Time: 1108-1140 OT Time Calculation (min): 32 min Charges:  OT General Charges $OT Visit: 1 Visit OT Evaluation $OT Eval Moderate Complexity: 1 Mod OT Treatments $Self Care/Home Management : 8-22 mins  Rosey Bath OTR/L Acute Rehabilitation Services Office: [phone]   Rebeca Alert 07/14/2020, 12:20 PM

## 2020-07-14 NOTE — Consult Note (Signed)
Physical Medicine and Rehabilitation Consult  Reason for Consult: Stroke with functional deficits Referring Physician: Dr. Pola Corn   HPI: Samantha Klein is a 69 y.o. female with history of PAF (Dr. Beverely Pace), T2DM- diet controlled,  hypothyroid who was admitted on 07/13/20 reports of sudden onset of severe pain behind right eye followed by vision loss in left eye on Sunday. She was evaluated by ophthalmologist the next day (11/22) and sent to ED for stroke work up as exam without opthalmic pathology. She has had some nausea and did have episode of emesis while at MD office. MRI brain done revealing acute or early subacute R-PCA territory infarcts involving right occipital lobe, right hippocampus, splenium of corpus callosum and possibly right thalamus with associated hemorrhage in right occipital lobe and parahippocampal region with local mass effect. MRA head/neck was negative for flow limiting stenosis distal R-PCA and moderate left P2 stenosis noted.  Follow up CT head this am showed R-PCA infarct without change in hemorrhage. Neurology recommended SBP goal<160 and 2D echo done this am for work up. PT evaluation this am revealed visual deficits as well as dizziness/nausea affecting mobility. CIR was recommended due to functional deficits.    Pt reports she's going home and cooking thankgiving dinner with help of her family.  Is getting some L sided vision back, just left of midline- can now read entire phone She doesn't want to go to CIR and has no interest in inpt rehab.  Her husband agrees.     Review of Systems  Constitutional: Negative for chills and fever.  HENT: Negative for hearing loss and tinnitus.   Eyes: Negative for pain.       No vision in left eye  Respiratory: Negative for cough and shortness of breath.   Cardiovascular: Negative for chest pain and palpitations.  Gastrointestinal: Positive for diarrhea (chronic) and nausea (with excessive activity/movement ).    Genitourinary: Negative for dysuria.  Musculoskeletal: Positive for joint pain (Knees stiff due to OA). Negative for myalgias.  Neurological: Positive for dizziness (with activity).  Psychiatric/Behavioral: The patient is nervous/anxious (worried about getting home/thanksgiving).   All other systems reviewed and are negative.     Past Medical History:  Diagnosis Date   Atrial fibrillation (HCC)    Chronic knee pain    left   Diabetes mellitus (HCC)    diet controlled   Hypothyroid    IBS (irritable bowel syndrome)-D    Thyroid disease     Past Surgical History:  Procedure Laterality Date   COLPOSCOPY W/ BIOPSY / CURETTAGE     TUBAL LIGATION       Family History  Problem Relation Age of Onset   Cervical cancer Mother      Social History:  Married. Retired 10 years ago to help take care of grandchild. She smoked for 7 years and quit 13 years ago. She denies use of alcohol or illicit drugs.    Allergies  Allergen Reactions   Tape Other (See Comments)    Adhesive tape causes redness   Sulfa Antibiotics Rash    Medications Prior to Admission  Medication Sig Dispense Refill   acetaminophen (TYLENOL) 500 MG tablet Take 1,000 mg by mouth every 4 (four) hours as needed (pain).     atorvastatin (LIPITOR) 20 MG tablet Take 20 mg by mouth at bedtime.     chlorhexidine (PERIDEX) 0.12 % solution Use as directed 15 mLs in the mouth or throat 3 (three) times daily after  meals. Swish and spit (do not swallow)     diclofenac (VOLTAREN) 75 MG EC tablet Take 75 mg by mouth at bedtime.     dicyclomine (BENTYL) 20 MG tablet Take 20 mg by mouth See admin instructions. Take one tablet (20 mg) by mouth four times daily - before meals and at bedtime     diltiazem (CARDIZEM CD) 180 MG 24 hr capsule Take 180 mg by mouth daily.     levothyroxine (SYNTHROID) 50 MCG tablet Take 50 mcg by mouth daily before breakfast.     metoprolol succinate (TOPROL-XL) 100 MG 24 hr tablet  Take 150 mg by mouth at bedtime.      Home: Home Living Family/patient expects to be discharged to:: Private residence Living Arrangements: Spouse/significant other Available Help at Discharge: Family, Available 24 hours/day Type of Home: House Home Access: Stairs to enter [employer] of Steps: 2 little steps Entrance Stairs-Rails: Right (at the front) Home Layout: One level Bathroom Shower/Tub: Health visitor: Standard Home Equipment: Information systems manager - built in, Environmental consultant - 2 wheels, [place] - single point  Functional History: Prior Function Level of Independence: Independent Comments: Doing ADls/IADLs, driving. No falls in last 6 months. Functional Status:  Mobility: Bed Mobility Overal bed mobility: Modified Independent General bed mobility comments: No assist needed. No dizziness. Transfers Overall transfer level: Needs assistance Equipment used: None Transfers: Sit to/from Stand Sit to Stand: Min assist General transfer comment: Min A to steady in standing as pt with LOB when turning to walk. Cues to slow down, for gaze stabilization and to be aware of left side. Ambulation/Gait Ambulation/Gait assistance: Min assist Gait Distance (Feet): 60 Feet Assistive device: 1 person hand held assist Gait Pattern/deviations: Step-through pattern, Decreased stride length, Wide base of support, Shuffle General Gait Details: Slow, guarded gait with pt reaching out with LUE to prevent from bumping into things. Cues to be aware of left environment. Cues for turns (eyes/head/body). Very unsteady. Reports feeling "swimmy headed" and nausea. Gait velocity: decreased    ADL:    Cognition: Cognition Overall Cognitive Status: Impaired/Different from baseline Orientation Level: Oriented X4 Cognition Arousal/Alertness: Awake/alert Behavior During Therapy: WFL for tasks assessed/performed Overall Cognitive Status: Impaired/Different from baseline Area of Impairment:  Safety/judgement, Awareness Safety/Judgement: Decreased awareness of safety, Decreased awareness of deficits Awareness: Intellectual General Comments: "I will be going home and cooking Thanksgiving dinner" Poor awarenes of deficits/overall condition impacting mobility/safety. Masks with humor at times.  Blood pressure (!) 163/107, pulse 88, temperature 98.3 F (36.8 C), temperature source Oral, resp. rate 16, height 5\' 7"  (1.702 m), weight 100.2 kg, SpO2 90 %. Physical Exam Vitals and nursing note reviewed.  Constitutional:      Appearance: Normal appearance. She is obese.     Comments: Awake, alert, sitting up, appropriate, husband at end of bed in chair, NAD- wearing eyeglasses  HENT:     Head: Normocephalic and atraumatic.     Comments: Equal smile- tongue midline    Right Ear: External ear normal.     Left Ear: External ear normal.     Nose: Nose normal. No congestion.     Mouth/Throat:     Mouth: Mucous membranes are moist.     Pharynx: Oropharynx is clear. No oropharyngeal exudate.  Eyes:     General:        Right eye: No discharge.        Left eye: No discharge.     Conjunctiva/sclera: Conjunctivae normal.     Comments:  EOMI B/L- no nystagmus, however still has L hemianopsia- however is now able to see just left of midline- could see her phone and read what was on phone up to ~ 20-30 degrees left of midline. Not further left than that.  Cardiovascular:     Rate and Rhythm: Normal rate and regular rhythm.     Heart sounds: Normal heart sounds. No murmur heard.  No gallop.   Pulmonary:     Comments: CTA B/L- no W/R/R- good air movement Abdominal:     Comments: Soft, NT, ND, (+)BS -hypoactive- just finished lunch  Musculoskeletal:     Cervical back: Normal range of motion. No rigidity.     Comments: Tested UEs- deltoid, biceps, triceps, WE, grip and finger abd- 5/5 B/L LEs- tested HF, KE, KF, DF and PF- all 5/5 B/L  Skin:    General: Skin is warm and dry.  Neurological:       Mental Status: She is alert and oriented to person, place, and time.     Comments: Speech clear. Follows commands without difficulty.  Loss of vision left eye and right periphery.    Light touch intact in all 4 extremities Is clear cannot bend over without nausea/dizziness, but appears to be cognizant of limitations.   Psychiatric:        Mood and Affect: Mood normal.        Behavior: Behavior normal.        Thought Content: Thought content normal.     Results for orders placed or performed during the hospital encounter of 07/13/20 (from the past 24 hour(s))  Protime-INR     Status: None   Collection Time: 07/13/20 12:39 PM  Result Value Ref Range   Prothrombin Time 14.9 11.4 - 15.2 seconds   INR 1.2 0.8 - 1.2  APTT     Status: None   Collection Time: 07/13/20 12:39 PM  Result Value Ref Range   aPTT 32 24 - 36 seconds  CBC     Status: Abnormal   Collection Time: 07/13/20 12:39 PM  Result Value Ref Range   WBC 10.1 4.0 - 10.5 K/uL   RBC 5.12 (H) 3.87 - 5.11 MIL/uL   Hemoglobin 15.6 (H) 12.0 - 15.0 g/dL   HCT 93.2 (H) 36 - 46 %   MCV 90.8 80.0 - 100.0 fL   MCH 30.5 26.0 - 34.0 pg   MCHC 33.5 30.0 - 36.0 g/dL   RDW 67.1 24.5 - 80.9 %   Platelets 255 150 - 400 K/uL   nRBC 0.0 0.0 - 0.2 %  Differential     Status: Abnormal   Collection Time: 07/13/20 12:39 PM  Result Value Ref Range   Neutrophils Relative % 83 %   Neutro Abs 8.3 (H) 1.7 - 7.7 K/uL   Lymphocytes Relative 10 %   Lymphs Abs 1.0 0.7 - 4.0 K/uL   Monocytes Relative 6 %   Monocytes Absolute 0.6 0.1 - 1.0 K/uL   Eosinophils Relative 0 %   Eosinophils Absolute 0.0 0.0 - 0.5 K/uL   Basophils Relative 1 %   Basophils Absolute 0.1 0.0 - 0.1 K/uL   Immature Granulocytes 0 %   Abs Immature Granulocytes 0.03 0.00 - 0.07 K/uL  Comprehensive metabolic panel     Status: Abnormal   Collection Time: 07/13/20 12:39 PM  Result Value Ref Range   Sodium 138 135 - 145 mmol/L   Potassium 3.3 (L) 3.5 - 5.1 mmol/L    Chloride 93 (L)  98 - 111 mmol/L   CO2 28 22 - 32 mmol/L   Glucose, Bld 154 (H) 70 - 99 mg/dL   BUN 8 8 - 23 mg/dL   Creatinine, Ser 2.42 0.44 - 1.00 mg/dL   Calcium 9.8 8.9 - 35.3 mg/dL   Total Protein 8.9 (H) 6.5 - 8.1 g/dL   Albumin 4.3 3.5 - 5.0 g/dL   AST 31 15 - 41 U/L   ALT 23 0 - 44 U/L   Alkaline Phosphatase 65 38 - 126 U/L   Total Bilirubin 1.3 (H) 0.3 - 1.2 mg/dL   GFR, Estimated >61 >44 mL/min   Anion gap 17 (H) 5 - 15  I-stat chem 8, ED     Status: Abnormal   Collection Time: 07/13/20  1:52 PM  Result Value Ref Range   Sodium 139 135 - 145 mmol/L   Potassium 3.2 (L) 3.5 - 5.1 mmol/L   Chloride 94 (L) 98 - 111 mmol/L   BUN 8 8 - 23 mg/dL   Creatinine, Ser 3.15 0.44 - 1.00 mg/dL   Glucose, Bld 400 (H) 70 - 99 mg/dL   Calcium, Ion 8.67 (L) 1.15 - 1.40 mmol/L   TCO2 30 22 - 32 mmol/L   Hemoglobin 16.3 (H) 12.0 - 15.0 g/dL   HCT 61.9 (H) 36 - 46 %  Respiratory Panel by RT PCR (Flu A&B, Covid) - Nasopharyngeal Swab     Status: None   Collection Time: 07/13/20  9:10 PM   Specimen: Nasopharyngeal Swab; Nasopharyngeal(NP) swabs in vial transport medium  Result Value Ref Range   SARS Coronavirus 2 by RT PCR NEGATIVE NEGATIVE   Influenza A by PCR NEGATIVE NEGATIVE   Influenza B by PCR NEGATIVE NEGATIVE  Glucose, capillary     Status: Abnormal   Collection Time: 07/13/20 10:45 PM  Result Value Ref Range   Glucose-Capillary 131 (H) 70 - 99 mg/dL   Comment 1 Notify RN    Comment 2 Document in Chart   TSH     Status: None   Collection Time: 07/14/20  4:16 AM  Result Value Ref Range   TSH 2.020 0.350 - 4.500 uIU/mL  HIV Antibody (routine testing w rflx)     Status: None   Collection Time: 07/14/20  4:16 AM  Result Value Ref Range   HIV Screen 4th Generation wRfx Non Reactive Non Reactive  Basic metabolic panel     Status: Abnormal   Collection Time: 07/14/20  4:16 AM  Result Value Ref Range   Sodium 136 135 - 145 mmol/L   Potassium 3.4 (L) 3.5 - 5.1 mmol/L   Chloride  95 (L) 98 - 111 mmol/L   CO2 29 22 - 32 mmol/L   Glucose, Bld 150 (H) 70 - 99 mg/dL   BUN 11 8 - 23 mg/dL   Creatinine, Ser 5.09 (H) 0.44 - 1.00 mg/dL   Calcium 9.5 8.9 - 32.6 mg/dL   GFR, Estimated 55 (L) >60 mL/min   Anion gap 12 5 - 15  CBC     Status: Abnormal   Collection Time: 07/14/20  4:16 AM  Result Value Ref Range   WBC 11.2 (H) 4.0 - 10.5 K/uL   RBC 4.68 3.87 - 5.11 MIL/uL   Hemoglobin 14.0 12.0 - 15.0 g/dL   HCT 71.2 36 - 46 %   MCV 90.2 80.0 - 100.0 fL   MCH 29.9 26.0 - 34.0 pg   MCHC 33.2 30.0 - 36.0 g/dL   RDW 14.0  11.5 - 15.5 %   Platelets 225 150 - 400 K/uL   nRBC 0.0 0.0 - 0.2 %  Lipid panel     Status: Abnormal   Collection Time: 07/14/20  4:16 AM  Result Value Ref Range   Cholesterol 160 0 - 200 mg/dL   Triglycerides 161 <096 mg/dL   HDL 28 (L) >04 mg/dL   Total CHOL/HDL Ratio 5.7 RATIO   VLDL 26 0 - 40 mg/dL   LDL Cholesterol 540 (H) 0 - 99 mg/dL   CT HEAD WO CONTRAST  Result Date: 07/14/2020 CLINICAL DATA:  Headache.  Follow-up hemorrhagic right PCA infarct. EXAM: CT HEAD WITHOUT CONTRAST TECHNIQUE: Contiguous axial images were obtained from the base of the skull through the vertex without intravenous contrast. COMPARISON:  Head CT and MRI 07/13/2020 FINDINGS: Brain: A large right PCA infarct is again seen with unchanged hemorrhage including a 3.0 x 1.3 cm region of hemorrhage in the right occipital lobe. Cytotoxic edema and local mass effect are unchanged. There is no midline shift. No new infarct, new intracranial hemorrhage, or extra-axial fluid collection is identified. A small chronic right cerebellar infarct is again noted. The ventricles are normal in size aside from partial effacement of the right lateral ventricle in the region of the infarct. Vascular: Calcified atherosclerosis at the skull base. Skull: No fracture or suspicious osseous lesion. Sinuses/Orbits: Small right maxillary sinus. Clear mastoid air cells. Unremarkable orbits. Other: None.  IMPRESSION: 1. Right PCA infarct with unchanged hemorrhage. 2. No evidence of new intracranial abnormality. Electronically Signed   By: Sebastian Ache M.D.   On: 07/14/2020 06:36   CT HEAD WO CONTRAST  Result Date: 07/13/2020 CLINICAL DATA:  Right-sided vision loss. EXAM: CT HEAD WITHOUT CONTRAST TECHNIQUE: Contiguous axial images were obtained from the base of the skull through the vertex without intravenous contrast. COMPARISON:  None. FINDINGS: Brain: 1.7 x 1.3 cm lobulated abnormality is seen in the right occipital lobe with surrounding white matter edema most consistent with neoplasm or malignancy. Acute infarction cannot be excluded. MRI is recommended for further evaluation. Mild chronic ischemic white matter disease is noted. Ventricular size is within normal limits. No midline shift is noted. No definite hemorrhage is noted. Vascular: No hyperdense vessel or unexpected calcification. Skull: Normal. Negative for fracture or focal lesion. Sinuses/Orbits: No acute finding. Other: None. IMPRESSION: 1.7 x 1.3 cm lobulated abnormality is seen in the right occipital lobe with surrounding white matter edema most consistent with neoplasm or malignancy. Acute infarction cannot be excluded. MRI with and without gadolinium is recommended for further evaluation. Electronically Signed   By: Lupita Raider M.D.   On: 07/13/2020 13:54   MR ANGIO HEAD WO CONTRAST  Result Date: 07/14/2020 CLINICAL DATA:  Right PCA infarct. EXAM: MRA NECK WITHOUT AND WITH CONTRAST MRA HEAD WITHOUT CONTRAST TECHNIQUE: Multiplanar and multiecho pulse sequences of the neck were obtained without and with intravenous contrast. Angiographic images of the neck were obtained using MRA technique without and with intravenous contast.; Angiographic images of the Circle of Willis were obtained using MRA technique without intravenous contrast. CONTRAST:  10mL GADAVIST GADOBUTROL 1 MMOL/ML IV SOLN COMPARISON:  None. FINDINGS: MRA NECK FINDINGS  There is a normal variant 4 vessel aortic arch with the left vertebral artery arising directly from the arch. The brachiocephalic and subclavian arteries are widely patent. The common carotid and cervical internal carotid arteries are patent without evidence of a dissection or definite significant stenosis. Motion artifact mildly limits assessment of the  proximal right ICA. The vertebral arteries are patent with antegrade flow bilaterally. There is no evidence of significant stenosis or dissection involving the right vertebral artery which is strongly dominant. Assessment of the proximal left V1 segment is limited by motion artifact and the small size of the vessel. There is no evidence of a significant stenosis or dissection involving the left vertebral artery more distally. A few soft tissue nodules in the right upper neck located inferior to the right parotid tail likely represent lymph nodes and measure up to 9 mm in short axis, nonspecific. MRA HEAD FINDINGS The intracranial vertebral arteries are widely patent to the basilar with the right being strongly dominant. Patent PICAs and SCAs are seen bilaterally. The basilar artery is widely patent. Posterior communicating arteries are not clearly identified and may be diminutive or absent. There is attenuation of distal right PCA branch vessels without evidence of a flow limiting proximal right PCA stenosis. There is a moderate mid left P2 stenosis. The internal carotid arteries are widely patent from skull base to carotid termini. ACAs and MCAs are patent without evidence of a proximal branch occlusion or significant proximal stenosis. No aneurysm is identified. IMPRESSION: 1. Attenuation of distal right PCA branch vessels without evidence of a flow limiting proximal stenosis or large vessel occlusion. 2. Moderate left P2 stenosis. 3. Patent cervical carotid and vertebral arteries without evidence of significant stenosis allowing for artifact through the proximal  right ICA and proximal left vertebral artery. Electronically Signed   By: Sebastian Ache M.D.   On: 07/14/2020 04:30   MR ANGIO NECK W WO CONTRAST  Result Date: 07/14/2020 CLINICAL DATA:  Right PCA infarct. EXAM: MRA NECK WITHOUT AND WITH CONTRAST MRA HEAD WITHOUT CONTRAST TECHNIQUE: Multiplanar and multiecho pulse sequences of the neck were obtained without and with intravenous contrast. Angiographic images of the neck were obtained using MRA technique without and with intravenous contast.; Angiographic images of the Circle of Willis were obtained using MRA technique without intravenous contrast. CONTRAST:  10mL GADAVIST GADOBUTROL 1 MMOL/ML IV SOLN COMPARISON:  None. FINDINGS: MRA NECK FINDINGS There is a normal variant 4 vessel aortic arch with the left vertebral artery arising directly from the arch. The brachiocephalic and subclavian arteries are widely patent. The common carotid and cervical internal carotid arteries are patent without evidence of a dissection or definite significant stenosis. Motion artifact mildly limits assessment of the proximal right ICA. The vertebral arteries are patent with antegrade flow bilaterally. There is no evidence of significant stenosis or dissection involving the right vertebral artery which is strongly dominant. Assessment of the proximal left V1 segment is limited by motion artifact and the small size of the vessel. There is no evidence of a significant stenosis or dissection involving the left vertebral artery more distally. A few soft tissue nodules in the right upper neck located inferior to the right parotid tail likely represent lymph nodes and measure up to 9 mm in short axis, nonspecific. MRA HEAD FINDINGS The intracranial vertebral arteries are widely patent to the basilar with the right being strongly dominant. Patent PICAs and SCAs are seen bilaterally. The basilar artery is widely patent. Posterior communicating arteries are not clearly identified and may be  diminutive or absent. There is attenuation of distal right PCA branch vessels without evidence of a flow limiting proximal right PCA stenosis. There is a moderate mid left P2 stenosis. The internal carotid arteries are widely patent from skull base to carotid termini. ACAs and MCAs are patent  without evidence of a proximal branch occlusion or significant proximal stenosis. No aneurysm is identified. IMPRESSION: 1. Attenuation of distal right PCA branch vessels without evidence of a flow limiting proximal stenosis or large vessel occlusion. 2. Moderate left P2 stenosis. 3. Patent cervical carotid and vertebral arteries without evidence of significant stenosis allowing for artifact through the proximal right ICA and proximal left vertebral artery. Electronically Signed   By: Sebastian Ache M.D.   On: 07/14/2020 04:30   MR Brain W and Wo Contrast  Result Date: 07/13/2020 EXAM: MRI HEAD WITHOUT AND WITH CONTRAST TECHNIQUE: Multiplanar, multiecho pulse sequences of the brain and surrounding structures were obtained without and with intravenous contrast. CONTRAST:  10mL GADAVIST GADOBUTROL 1 MMOL/ML IV SOLN COMPARISON:  Same day head CT. FINDINGS: Brain: Acute confluent infarct in the right occipital lobe. There is local mass effect without midline shift. Additional smaller infarcts in the right hippocampus and right aspect of the splenium of the corpus callosum. Possible punctate infarct in the right thalamus (series 5, image 77). Associated acute hemorrhage in the right occipital lobe and parahippocampal region . No hydrocephalus. Remote bilateral cerebellar lacunar infarcts. Mild scattered T2/FLAIR hyperintensities, compatible with chronic microvascular ischemic disease. No masslike enhancement. Vascular: Major arterial flow voids are maintained at the skull base. Skull and upper cervical spine: Normal marrow signal. Sinuses/Orbits: Negative.  Atretic right maxillary sinus. Other: No mastoid effusions. IMPRESSION:  Acute or early subacute right PCA territory infarcts involving the right occipital lobe, right hippocampus, right eccentric splenium of the corpus callosum, and possibly the right thalamus. There is associated acute hemorrhage in the right occipital lobe and parahippocampal region. Local mass effect without midline shift. Finding discussed with Dr. Bernette Mayers At 5:10 PM via telephone. Electronically Signed   By: Feliberto Harts MD   On: 07/13/2020 17:17     Assessment/Plan: Diagnosis: R occipital stroke due to infarct vs embolism with secondary hemorrhage 1. Does the need for close, 24 hr/day medical supervision in concert with the patient's rehab needs make it unreasonable for this patient to be served in a less intensive setting? No 2. Co-Morbidities requiring supervision/potential complications: A fib, hypothyroidism, L hemianopsia, decreased safety awareness 3. Due to safety, disease management, medication administration and patient education, does the patient require 24 hr/day rehab nursing? Potentially 4. Does the patient require coordinated care of a physician, rehab nurse, therapy disciplines of PT, OT to address physical and functional deficits in the context of the above medical diagnosis(es)? Potentially Addressing deficits in the following areas: balance, endurance, locomotion, transferring, bathing, dressing, feeding, grooming, toileting and vision perception 5. Can the patient actively participate in an intensive therapy program of at least 3 hrs of therapy per day at least 5 days per week? Yes 6. The potential for patient to make measurable gains while on inpatient rehab is excellent 7. Anticipated functional outcomes upon discharge from inpatient rehab are modified independent  with PT, modified independent with OT, n/a with SLP. 8. Estimated rehab length of stay to reach the above functional goals is: - pt refuses inpt rehab/CIR 9. Anticipated discharge destination: Home 10. Overall  Rehab/Functional Prognosis: excellent  RECOMMENDATIONS: This patient's condition is appropriate for continued rehabilitative care in the following setting: Home Excercise Program and Outpatient Therapy Patient has agreed to participate in recommended program. No Note that insurance prior authorization may be required for reimbursement for recommended care.  Comment:  1. Went over with pt about patients that have strokes are more likely to get tired/need more  rest than normal in the next 2-4 weeks- encouraged her and her husband to allocate chores/cooking, etc so she can get this rest.  > also recommended being the "general" at home, and arranging/planning things, but let family carry out the plans for now. Esp Thanksgiving dinner.   2. 40% of patients that have a stroke can develop spasticity- think overall, it's good for every stroke patient to follow up with outpatient PM&R after d/c- in ~ 1-2 months- gave pt and husband the number for clinic- to follow up with PM&R stroke physician, Dr Wynn [phone]- [phone]- suggested they call for appointment  3. Suggest inpt PT and OT give patient recommendations for any adaptive equipment they need, and arrange for d/c by tomorrow, if possible. Pt is likely too high of level for CIR AND isn't interested in our program.   4. Thank you for this consult.   Jacquelynn Creeamela S Love, PA-C 07/14/2020    I have personally performed a face to face diagnostic evaluation of this patient and formulated the key components of the plan.  Additionally, I have personally reviewed laboratory data, imaging studies, as well as relevant notes and concur with the physician assistant's documentation above.

## 2020-07-14 NOTE — Progress Notes (Signed)
Inpatient Rehab Admissions Coordinator Note:   Per PT recommendations, pt was screened for CIR candidacy by Estill Dooms, PT, DPT.  At this time we are recommending a CIR consult and I will place an order per our protocol.  Please contact me with questions.   Estill Dooms, PT, DPT [phone] 07/14/20 9:56 AM

## 2020-07-14 NOTE — Care Management Obs Status (Signed)
MEDICARE OBSERVATION STATUS NOTIFICATION   Patient Details  Name: Samantha Klein MRN: [id number] Date of Birth: 1951/01/20   Medicare Observation Status Notification Given:  Yes    Kermit Balo, RN 07/14/2020, 4:10 PM

## 2020-07-14 NOTE — Progress Notes (Signed)
  Echocardiogram 2D Echocardiogram with definity and bubble study has been performed.  Leta Jungling M 07/14/2020, 10:18 AM

## 2020-07-14 NOTE — Progress Notes (Signed)
Take pt to ct scan for repeat  head CT, pt still alert oriented, no complain at this time

## 2020-07-14 NOTE — Progress Notes (Signed)
Pt given with phenergan IV as order by Dr Antionette Char since pt cannot tolerate to lie still on MRI bed for the procedure

## 2020-07-21 ENCOUNTER — Other Ambulatory Visit: Payer: Self-pay | Admitting: Neurology

## 2020-07-21 DIAGNOSIS — I6300 Cerebral infarction due to thrombosis of unspecified precerebral artery: Secondary | ICD-10-CM

## 2020-07-21 NOTE — Progress Notes (Signed)
Ct head 

## 2020-07-28 ENCOUNTER — Ambulatory Visit (INDEPENDENT_AMBULATORY_CARE_PROVIDER_SITE_OTHER): Payer: Medicare Other | Admitting: Adult Health

## 2020-07-28 VITALS — BP 188/86 | HR 55 | Ht 66.0 in | Wt 230.0 lb

## 2020-07-28 DIAGNOSIS — Z8673 Personal history of transient ischemic attack (TIA), and cerebral infarction without residual deficits: Secondary | ICD-10-CM

## 2020-07-28 DIAGNOSIS — I10 Essential (primary) hypertension: Secondary | ICD-10-CM | POA: Diagnosis not present

## 2020-07-28 DIAGNOSIS — I480 Paroxysmal atrial fibrillation: Secondary | ICD-10-CM | POA: Diagnosis not present

## 2020-07-28 DIAGNOSIS — E785 Hyperlipidemia, unspecified: Secondary | ICD-10-CM | POA: Diagnosis not present

## 2020-07-28 DIAGNOSIS — E119 Type 2 diabetes mellitus without complications: Secondary | ICD-10-CM

## 2020-07-28 NOTE — Patient Instructions (Signed)
Recommend doing exercises as printed out today - follow up with your eye doctor and have them send [place] the exam information after visit  I will keep you updated regarding either starting Eliquis or repeating CT head in a few weeks  Continue aspirin 81 mg daily  and atorvastatin 40mg  daily  for secondary stroke prevention  Continue to follow up with PCP regarding cholesterol, blood pressure and diabetes management  Maintain strict control of hypertension with blood pressure goal below 130/90, diabetes with hemoglobin A1c goal below 7% and cholesterol with LDL cholesterol (bad cholesterol) goal below 70 mg/dL.      Followup in the future with me in 3 months or call earlier if needed      Thank you for coming to see at [facility]. I hope we have been able to provide you high quality care today.  You may receive a patient satisfaction survey over the next few weeks. We would appreciate your feedback and comments so that we may continue to improve ourselves and the health of our patients.

## 2020-07-28 NOTE — Progress Notes (Signed)
[facility] [address] [place] [address] [phone]       HOSPITAL FOLLOW UP NOTE  [name] Date of Birth:  [birth year] Medical Record Number:  [id number]   Reason for Referral:  hospital stroke follow up    SUBJECTIVE:   CHIEF COMPLAINT:  Chief Complaint  Patient presents with  . Hospitalization Follow-up    rm 8  . Cerebrovascular Accident    Pt said sheis here for a f/u on her CT. Pt said sheis having changes in her vision.    HPI:   [name] is a 69 y.o. female with history of atrial fibrillation, hypothyroidism, and HLD who presented on 07/13/2020 with L hemianopsia, nausea and vomiting.  Personally reviewed hospitalization pertinent progress notes, lab work and imaging with summary provided.  Evaluated by Dr. 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  Initiated aspirin and advised to repeat CT head in 1 week and initiate Eliquis if hemorrhage resolved in setting of recent stroke and history of A. fib.  History of HLD with LDL 106 on atorvastatin 20 mg daily PTA and recommended increasing to 40 mg daily.  No prior history of HTN or DM.  Other stroke risk factors include advanced age and obesity but no prior stroke history. Per therapy recommendations, d/c'd home with OP PT/OT.   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   MRI  R PCA infarct (occipital, hippocampus, eccentric splenium of corpus callosum, thalamus). Hemorrhage in R occipital lobe and parahippocampus. Local mass effect  MRA head & neck small R PCA branch vessels. Moderate L P2 stenosis.   CT head stable R PCA infarct   2D Echo EF 60-65%.  No source of embolus. Neg bubble  LDL 106  HgbA1c 6.5   VTE prophylaxis - SCDs   No antithrombotic prior to admission, now on No antithrombotic given hemorrhagic transformation. Ok to add aspirin 81 mg Wednesday. If CT neg in 1 week, stop aspirin and add Eliquis.  Therapy recommendations:  OP OT, HH or OP PT  Disposition:  Return home  Today, 07/28/2020, Samantha Klein is being seen for hospital follow-up accompanied by her husband.  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  She has follow-up with ophthalmology on 12/9.  Also reports occasional blurriness and started using artificial tears with benefit.  Has been experiencing increased anxiety due to visual loss with history of underlying anxiety/depression.  Denies new stroke/TIA symptoms.  Remains on aspirin 81 mg daily without bleeding or bruising.  Remains on atorvastatin per PCP recently increasing dosage to 40 mg daily and denies any myalgias.  Blood pressure today elevated with fluctuation at home which she believes is due to anxiety.  Repeat CT head ordered by PCP completed yesterday which showed unchanged appearance of hemorrhage.  PCP ordered cardiac monitor which she plans on starting 12/21 to further assess A. fib burden.  No further concerns at this time.     ROS:   14 system review of systems performed and negative with exception of those listed in HPI  PMH:  Past Medical History:  Diagnosis Date  . Atrial fibrillation (HCC)   . Chronic knee pain    left  .  Diabetes mellitus (HCC)    diet controlled  . Hypothyroid   . IBS (irritable bowel syndrome)-D   . Thyroid disease     PSH:  Past Surgical History:  Procedure Laterality Date  . COLPOSCOPY W/ BIOPSY / CURETTAGE    . TUBAL LIGATION      Social History:  Social History   Socioeconomic History  . Marital status: Married    Spouse name: Not on file  . Number of children: Not on file  . Years of education: Not on file  . Highest  education level: Not on file  Occupational History  . Not on file  Tobacco Use  . Smoking status: Not on file  Substance and Sexual Activity  . Alcohol use: Not on file  . Drug use: Not on file  . Sexual activity: Not on file  Other Topics Concern  . Not on file  Social History Narrative  . Not on file   Social Determinants of Health   Financial Resource Strain:   . Difficulty of Paying Living Expenses: Not on file  Food Insecurity:   . Worried About Programme researcher, broadcasting/film/video in the Last Year: Not on file  . Ran Out of Food in the Last Year: Not on file  Transportation Needs:   . Lack of Transportation (Medical): Not on file  . Lack of Transportation (Non-Medical): Not on file  Physical Activity:   . Days of Exercise per Week: Not on file  . Minutes of Exercise per Session: Not on file  Stress:   . Feeling of Stress : Not on file  Social Connections:   . Frequency of Communication with Friends and Family: Not on file  . Frequency of Social Gatherings with Friends and Family: Not on file  . Attends Religious Services: Not on file  . Active Member of Clubs or Organizations: Not on file  . Attends Banker Meetings: Not on file  . Marital Status: Not on file  Intimate Partner Violence:   . Fear of Current or Ex-Partner: Not on file  . Emotionally Abused: Not on file  . Physically Abused: Not on file  . Sexually Abused: Not on file    Family History:  Family History  Problem Relation Age of Onset  . Cervical cancer Mother     Medications:   Current Outpatient Medications on File Prior to Visit  Medication Sig Dispense Refill  . aspirin EC 81 MG tablet Take 1 tablet (81 mg total) by mouth daily. Swallow whole. 150 tablet 2  . atorvastatin (LIPITOR) 20 MG tablet Take 40 mg by mouth at bedtime.     . diclofenac (VOLTAREN) 75 MG EC tablet Take 75 mg by mouth at bedtime.    . dicyclomine (BENTYL) 20 MG tablet Take 20 mg by mouth See admin instructions. Take one  tablet (20 mg) by mouth four times daily - before meals and at bedtime    . diltiazem (CARDIZEM CD) 180 MG 24 hr capsule Take 240 mg by mouth daily.     Marland Kitchen levothyroxine (SYNTHROID) 50 MCG tablet Take 50 mcg by mouth daily before breakfast.    . metoprolol succinate (TOPROL-XL) 100 MG 24 hr tablet Take 150 mg by mouth at bedtime.     No current facility-administered medications on file prior to visit.    Allergies:   Allergies  Allergen Reactions  . Tape Other (See Comments)    Adhesive tape causes redness  . Sulfa Antibiotics Rash  OBJECTIVE:  Physical Exam  Vitals:   07/28/20 1503  BP: (!) 188/86  Pulse: (!) 55  Weight: 230 lb (104.3 kg)  Height: 5\' 6"  (1.676 m)   Body mass index is 37.12 kg/m. No exam data present  General: Obese pleasantly anxious elderly Caucasian female, seated, in no evident distress Head: head normocephalic and atraumatic.   Neck: supple with no carotid or supraclavicular bruits Cardiovascular: irregular rate and rhythm, no murmurs Musculoskeletal: no deformity Skin:  no rash/petichiae Vascular:  Normal pulses all extremities   Neurologic Exam Mental Status: Awake and fully alert.  Fluent speech and language. Oriented to place and time. Recent and remote memory intact. Attention span, concentration and fund of knowledge appropriate. Mood and affect appropriate.  Cranial Nerves: Fundoscopic exam reveals sharp disc margins. Pupils equal, briskly reactive to light. Extraocular movements full without nystagmus. Visual fields show left superior homonymous quadrantanopia. Hearing intact. Facial sensation intact. Face, tongue, palate moves normally and symmetrically.  Motor: Normal bulk and tone. Normal strength in all tested extremity muscles. Sensory.: intact to touch , pinprick , position and vibratory sensation.  Coordination: Rapid alternating movements normal in all extremities. Finger-to-nose and heel-to-shin performed accurately  bilaterally. Gait and Station: Arises from chair without difficulty. Stance is normal. Gait demonstrates normal stride length and balance Reflexes: 1+ and symmetric. Toes downgoing.     NIHSS  1 Modified Rankin  2     ASSESSMENT: Samantha Klein is a 69 y.o. year old female presented with left hemianopia, nausea and vomiting on 07/13/2020 with stroke work-up revealing right PCA infarct (occipital, hippocampus, eccentric splenium of corpus callosum and thalamus) with hemorrhagic transformation (occipital and parahippocampus) secondary to known AF not on AC. Vascular risk factors include HTN, HLD, DM type 2, advanced age and obesity.      PLAN:  1. R PCA stroke :  a. Residual deficit: Left homonymous superior quadrantanopia.  Subjective improvement since discharge with evaluation by ophthalmology 12/9.  Anxiety worsened post stroke but declines interest in medication options or counseling at this time.  Provided visual exercises to do at home for hopeful ongoing improvement.   b. Repeat CT head 07/27/2020 (completed at [facility] imaging[facility]): R PCA infarct with unchanged hemorrhage. Will further discuss with Dr. [facility] in regards to initiating [facility] vs potential need of repeating imaging in a couple of weeks. c. Continue atorvastatin 40 mg daily for secondary stroke prevention.  d. Discussed secondary stroke prevention measures and importance of close PCP follow up for aggressive stroke risk factor management  2. Atrial fibrillation: CHA2DS2-VASc score 5 indicating high risk for stroke.  Not previously on [facility] as CHA2DS2-VASc score 2 prior to stroke. See #1b in regards to [facility].  PCP ordered Zio patch to assess A. fib burden for potential candidate for cardioversion if indicated.  Also advised to follow-up with cardiology 3. HTN: BP goal<130/90.  Elevated today but stable at home per patient.  On diltiazem and metoprolol per PCP 4. HLD: LDL goal <70. Recent LDL 101 on atorvastatin 20 mg  daily.  Dosage increase held during stroke admission due to hemorrhagic conversion.  PCP increased atorvastatin to 40 mg daily on 07/24/2020 5. DMII: A1c goal<7.0.  Recent A1c 6.5 down from prior 6.8.  Not currently on pharmacological management.  Monitored/managed by PCP     Follow up in 3 months or call earlier if needed   I spent a prolonged 60 minutes of face-to-face and non-face-to-face time with patient and husband.  This  included previsit chart review including review of recent hospitalization pertinent progress notes, lab work and imaging as well as [facility], lab review, study review, order entry, electronic health record documentation, patient education regarding recent stroke and etiology, residual deficits, reviewed CT head importance of managing stroke risk factors and answered all questions to patient and husband's satisfaction     Ihor AustinJessica McCue, AGNP-BC  [facility] [address] [place], [place] [id number]  Phone [phone] Fax [phone] Note: This document was prepared with digital dictation and possible smart phrase technology. Any transcriptional errors that result from this process are unintentional.

## 2020-08-03 ENCOUNTER — Telehealth: Payer: Self-pay | Admitting: Adult Health

## 2020-08-03 ENCOUNTER — Telehealth: Payer: Self-pay | Admitting: *Deleted

## 2020-08-03 NOTE — Telephone Encounter (Addendum)
I spoke to pt, gave results, her pharmacy is [facility].  Coupon free first month?  Eliquis?

## 2020-08-03 NOTE — Telephone Encounter (Signed)
I called and CT results given improved from 3 wks,  Ok to start anticoag therapy.  Per JM/NP when she returns tomorrow.  30 day supplu [facility].  (some coupon for free month).  Pt is medicare.

## 2020-08-03 NOTE — Telephone Encounter (Signed)
-----   Message from Micki Riley, MD sent at 08/03/2020  4:50 PM EST ----- Okay to start anticoagulation for this patient as she is more than 3 weeks out and blood has become isodense on the CT scan.

## 2020-08-03 NOTE — Telephone Encounter (Signed)
Pt. states NP was going to be talking with Dr. to concur about her getting on a blood thinner. She states she was calling to follow up as she has not heard anything. She is asking when & who will put her on blood thinner. Please advise.

## 2020-08-03 NOTE — Telephone Encounter (Signed)
I called and spoke to husband,  I relayed that I wil send message to JM/.NP who is out today, but can ask Dr. Pearlean Brownie about the CT and blood thinner:  CT Head Wo Contrast  [facility]:   Anatomical Region Laterality Modality Head -- Computed Tomography   Narrative Performed by [facility]INICAL DATA: Follow-up stroke.   EXAM:  CT HEAD WITHOUT CONTRAST   TECHNIQUE:  Contiguous axial images were obtained from the base of the skull  through the vertex without intravenous contrast.   COMPARISON: CT head 07/14/2020   FINDINGS:  Brain:Subacute infarct right occipital lobe with hemorrhage has  become more isodense with brain. No new area of hemorrhage.  Decreased mass-effect and edema.   Ventricle size normal. No other areas of acute infarct or  hemorrhage. No mass lesion.   Vascular: Negative for hyperdense vessel   Skull: Negative   Sinuses/Orbits: Paranasal sinuses clear. Negative orbit   Other: None   IMPRESSION:  Subacute hemorrhagic infarct right occipital lobe has become more  isodense with decreased edema compared to the prior study. No new  area of infarct or hemorrhage.    Electronically Signed  By: Marlan Palau M.D.  On: 07/27/2020 13:20

## 2020-08-03 NOTE — Telephone Encounter (Signed)
The CT scan shows expected findings of decrease in the blood density and swelling no new or worrisome finding.  Nothing unexpected.

## 2020-08-04 ENCOUNTER — Encounter: Payer: Self-pay | Admitting: Adult Health

## 2020-08-04 NOTE — Telephone Encounter (Signed)
Noted! Thank you

## 2020-08-04 NOTE — Telephone Encounter (Signed)
She can look online for possible further assistance but typically coupons do not cover if patient on Medicare or Medicaid.  Typically, cardiology is able to further assist with the financial part as they are typically prescribing this medication.

## 2020-08-04 NOTE — Telephone Encounter (Signed)
In this specific case, yes.  She has appointment scheduled with cardiology next week

## 2020-08-04 NOTE — Telephone Encounter (Signed)
I called pt and relayed per JM/NP that she is to get the prescription for blood thinner from cardiology Dr. Maryln Gottron [facility] [phone].  Pt has appt next week.

## 2020-08-11 NOTE — Progress Notes (Signed)
I agree with the above plan 

## 2020-08-12 ENCOUNTER — Inpatient Hospital Stay: Payer: Medicare Other | Admitting: Adult Health

## 2020-11-02 ENCOUNTER — Encounter: Payer: Self-pay | Admitting: Adult Health

## 2020-11-02 ENCOUNTER — Ambulatory Visit: Payer: Medicare HMO | Admitting: Adult Health

## 2020-11-02 VITALS — BP 123/74 | HR 74 | Ht 66.0 in | Wt 222.0 lb

## 2020-11-02 DIAGNOSIS — H53462 Homonymous bilateral field defects, left side: Secondary | ICD-10-CM

## 2020-11-02 DIAGNOSIS — Z8673 Personal history of transient ischemic attack (TIA), and cerebral infarction without residual deficits: Secondary | ICD-10-CM

## 2020-11-02 NOTE — Patient Instructions (Addendum)
Continue Eliquis (apixaban) daily  and atorvastatin for secondary stroke prevention  Continue to follow with cardiology routinely for atrial fibrillation and Eliquis management  Recommend further evaluation for possible sleep apnea as you are at increased risk with atrial fibrillation and history of stroke.  Please see additional information provided.  If you would like to pursue further evaluation, please call office and I will place referral   Continue to follow up with PCP regarding cholesterol, blood pressure and diabetes management  Maintain strict control of hypertension with blood pressure goal below 130/90, diabetes with hemoglobin A1c goal below 7% and cholesterol with LDL cholesterol (bad cholesterol) goal below 70 mg/dL.       Followup in the future with me in 6 months or call earlier if needed       Thank you for coming to see [place] at [facility]. I hope we have been able to provide you high quality care today.  You may receive a patient satisfaction survey over the next few weeks. We would appreciate your feedback and comments so that we may continue to improve ourselves and the health of our patients.

## 2020-11-02 NOTE — Progress Notes (Signed)
[facility] [address] [place]. [place] [address] [phone]       STROKE FOLLOW UP NOTE  [name] Date of Birth:  1950-10-23 Medical Record Number:  [id number]   Reason for Referral: stroke follow up    SUBJECTIVE:   CHIEF COMPLAINT:  Chief Complaint  Patient presents with  . Follow-up    RM 14 with husband (boyce) PT is well, no complaints     HPI:   Today, 11/02/2020, Samantha Klein returns for 10-month stroke follow-up accompanied by her husband  Reports residual left-sided periphery impairment but does report continued improvement since prior visit Denies new stroke/TIA symptoms She does report improvement of anxiety since prior visit  Compliant on Eliquis 5mg  BID -denies side effects Compliant on atorvastatin 40 mg daily -denies side effects Blood pressure today 123/74 Reports recent lipid panel in Jan w/ PCP but unable to find results via epic  s/p unsuccessful cardioversion 2/24 -routinely followed by [facility] cardiology Plans on repeating cardiac monitor at the end of this week to assess for further A. fib burden and possible conversion to normal sinus rhythm after recent medication adjustments  No further concerns at this time   History provided for reference purposes only Initial visit 07/28/2020 JM: Samantha Klein is being seen for hospital follow-up accompanied by her husband.  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  She has follow-up with ophthalmology on 12/9.  Also reports occasional blurriness and started using artificial tears with benefit.  Has been experiencing increased anxiety due to visual loss with history of underlying anxiety/depression.  Denies new stroke/TIA symptoms.  Remains on aspirin 81 mg daily without bleeding or bruising.  Remains on atorvastatin per PCP recently increasing dosage to 40 mg daily and denies any myalgias.  Blood pressure today elevated with fluctuation at home  which she believes is due to anxiety.  Repeat CT head ordered by PCP completed yesterday which showed unchanged appearance of hemorrhage.  PCP ordered cardiac monitor which she plans on starting 12/21 to further assess A. fib burden.  No further concerns at this time.  Stroke admission 07/13/2020 [name] is a 70 y.o. female with history of atrial fibrillation, hypothyroidism, and HLD who presented on 07/13/2020 with L hemianopsia, nausea and vomiting.  Personally reviewed hospitalization pertinent progress notes, lab work and imaging with summary provided.  Evaluated by Dr. 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  Initiated aspirin and advised to repeat CT head in 1 week and initiate Eliquis if hemorrhage resolved in setting of recent stroke and history of A. fib.  History of HLD with LDL 106 on atorvastatin 20 mg daily PTA and recommended increasing to 40 mg daily.  No prior history of HTN or DM.  Other stroke risk factors include advanced age and obesity but no prior stroke history. Per therapy recommendations, d/c'd home with OP PT/OT.   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   MRI  R PCA infarct (occipital, hippocampus, eccentric splenium of corpus callosum, thalamus). Hemorrhage in R occipital lobe and parahippocampus. Local mass effect  MRA head & neck small R PCA branch vessels. Moderate L P2 stenosis.   CT head stable R PCA  infarct   2D Echo EF 60-65%. No source of embolus. Neg bubble  LDL 106  HgbA1c 6.5   VTE prophylaxis - SCDs   No antithrombotic prior to admission, now on No antithrombotic given hemorrhagic transformation. Ok to add aspirin 81 mg  Wednesday. If CT neg in 1 week, stop aspirin and add Eliquis.  Therapy recommendations:  OP OT, HH or OP PT  Disposition:  Return home      ROS:   14 system review of systems performed and negative with exception of those listed in HPI  PMH:  Past Medical History:  Diagnosis Date  . Atrial fibrillation (HCC)   . Chronic knee pain    left  . Diabetes mellitus (HCC)    diet controlled  . Hypothyroid   . IBS (irritable bowel syndrome)-D   . Thyroid disease     PSH:  Past Surgical History:  Procedure Laterality Date  . COLPOSCOPY W/ BIOPSY / CURETTAGE    . TUBAL LIGATION      Social History:  Social History   Socioeconomic History  . Marital status: Married    Spouse name: Not on file  . Number of children: Not on file  . Years of education: Not on file  . Highest education level: Not on file  Occupational History  . Not on file  Tobacco Use  . Smoking status: Not on file  . Smokeless tobacco: Not on file  Substance and Sexual Activity  . Alcohol use: Not on file  . Drug use: Not on file  . Sexual activity: Not on file  Other Topics Concern  . Not on file  Social History Narrative  . Not on file   Social Determinants of Health   Financial Resource Strain: Not on file  Food Insecurity: Not on file  Transportation Needs: Not on file  Physical Activity: Not on file  Stress: Not on file  Social Connections: Not on file  Intimate Partner Violence: Not on file    Family History:  Family History  Problem Relation Age of Onset  . Cervical cancer Mother     Medications:   Current Outpatient Medications on File Prior to Visit  Medication Sig Dispense Refill  . atorvastatin (LIPITOR) 20 MG tablet Take 40 mg by mouth at bedtime.     . dicyclomine (BENTYL) 20 MG tablet Take 20 mg by mouth See admin instructions. Take one tablet (20 mg) by mouth four times daily - before meals and at bedtime    . diltiazem (CARDIZEM CD) 180 MG 24 hr capsule Take 240 mg by  mouth daily.     Marland Kitchen ELIQUIS 5 MG TABS tablet Take 5 mg by mouth 2 (two) times daily.    . flecainide (TAMBOCOR) 100 MG tablet Take by mouth.    . levothyroxine (SYNTHROID) 50 MCG tablet Take 50 mcg by mouth daily before breakfast.    . losartan (COZAAR) 50 MG tablet TAKE 1 TABLET BY MOUTH EACH DAY    . metoprolol succinate (TOPROL-XL) 100 MG 24 hr tablet Take 150 mg by mouth at bedtime.     No current facility-administered medications on file prior to visit.    Allergies:   Allergies  Allergen Reactions  . Tape Other (See Comments)    Adhesive tape causes redness  . Sulfa Antibiotics Rash      OBJECTIVE:  Physical Exam  Vitals:   11/02/20 1505  BP: 123/74  Pulse: 74  Weight: 222 lb (100.7 kg)  Height:  5\' 6"  (1.676 m)   Body mass index is 35.83 kg/m. No exam data present  General: Obese very pleasant elderly Caucasian female, seated, in no evident distress Head: head normocephalic and atraumatic.   Neck: supple with no carotid or supraclavicular bruits Cardiovascular: irregular rate and rhythm, no murmurs Musculoskeletal: no deformity Skin:  no rash/petichiae Vascular:  Normal pulses all extremities   Neurologic Exam Mental Status: Awake and fully alert.  Fluent speech and language. Oriented to place and time. Recent and remote memory intact. Attention span, concentration and fund of knowledge appropriate. Mood and affect appropriate.  Cranial Nerves: Pupils equal, briskly reactive to light. Extraocular movements full without nystagmus. Visual fields show left partial superior homonymous quadrantanopia. Hearing intact. Facial sensation intact. Face, tongue, palate moves normally and symmetrically.  Motor: Normal bulk and tone. Normal strength in all tested extremity muscles. Sensory.: intact to touch , pinprick , position and vibratory sensation.  Coordination: Rapid alternating movements normal in all extremities. Finger-to-nose and heel-to-shin performed accurately  bilaterally. Gait and Station: Arises from chair without difficulty. Stance is normal. Gait demonstrates normal stride length and balance without use of assistive device Reflexes: 1+ and symmetric. Toes downgoing.       ASSESSMENT: Samantha Klein is a 70 y.o. year old female presented with left hemianopia, nausea and vomiting on 07/13/2020 with stroke work-up revealing right PCA infarct (occipital, hippocampus, eccentric splenium of corpus callosum and thalamus) with hemorrhagic transformation (occipital and parahippocampus) secondary to known AF not on AC. Vascular risk factors include HTN, HLD, DM type 2, advanced age and obesity.      PLAN:  1. R PCA stroke :  a. Residual deficit: Left homonymous superior quadrantanopia.  Subjective mild improvement since prior visit b. Repeat CT head 07/27/2020 (completed at [facility] CT imaging[facility]): R PCA infarct with unchanged hemorrhage - recommend starting of AC c. Continue Eliquis 5 mg twice daily and atorvastatin 40 mg daily for secondary stroke prevention.   d. Discussed secondary stroke prevention measures and importance of close PCP follow up for aggressive stroke risk factor management  2. Atrial fibrillation:  a. On Eliquis 5 mg BID for CHA2DS2-VASc score of at least 5.   b. Followed by [facility] cardiology - Zio monitor showed A. fib burden of 100% s/p unsuccessful cardioversion 10/15/2020.  Medication changes for hopeful conversion to NSR and plans on repeat cardiac monitor at the end of this week 3. HTN: BP goal<130/90.  Well-controlled on current regimen per PCP/cardiology 4. HLD: LDL goal <70. On atorvastatin 40 mg daily per PCP -request follow-up with PCP to ensure cholesterol levels monitored and ongoing prescribing of statin 5. DMII: A1c goal<7.0.  Recent A1c 6.6 10/2020 monitor by PCP     Follow up in 6 months or call earlier if needed    CC:  [facility] provider: Dr. 11/2020, Patrice Paradise, PA-C     I spent 30  minutes of face-to-face and non-face-to-face time with patient and husband.  This included previsit chart review, lab review, study review, order entry, electronic health record documentation, patient education regarding prior stroke and etiology, residual deficits, known A fib on current medications and recent procedures, importance of managing stroke risk factors and answered all other questions to patient satisfaction   Sue Lush, AGNP-BC  [facility] [address] [place], [place]  Phone [phone] Fax [phone] Note: This document was prepared with digital dictation and possible smart phrase technology. Any transcriptional errors that result from  this process are unintentional.

## 2020-11-08 NOTE — Progress Notes (Signed)
I agree with the above plan 

## 2021-05-12 ENCOUNTER — Ambulatory Visit: Payer: Medicare HMO | Admitting: Adult Health
